# Patient Record
Sex: Female | Born: 1985 | Race: Black or African American | Hispanic: No | Marital: Single | Smoking: Never smoker
Health system: Southern US, Community
[De-identification: ages and names within clinical notes are randomized; demographics above are authoritative.]

## PROBLEM LIST (undated history)

## (undated) DIAGNOSIS — I10 Essential (primary) hypertension: Secondary | ICD-10-CM

## (undated) DIAGNOSIS — O34219 Maternal care for unspecified type scar from previous cesarean delivery: Secondary | ICD-10-CM

---
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
## 2007-04-08 ENCOUNTER — Other Ambulatory Visit: Admission: RE | Admit: 2007-04-08 | Discharge: 2007-04-08 | Payer: Self-pay | Admitting: Internal Medicine

## 2008-08-28 ENCOUNTER — Inpatient Hospital Stay (HOSPITAL_COMMUNITY): Admission: AD | Admit: 2008-08-28 | Discharge: 2008-09-02 | Payer: Self-pay | Admitting: Obstetrics & Gynecology

## 2008-08-28 ENCOUNTER — Ambulatory Visit: Payer: Self-pay | Admitting: Family Medicine

## 2008-08-30 ENCOUNTER — Encounter: Payer: Self-pay | Admitting: Family Medicine

## 2008-09-06 ENCOUNTER — Ambulatory Visit: Payer: Self-pay | Admitting: Physician Assistant

## 2008-09-06 ENCOUNTER — Inpatient Hospital Stay (HOSPITAL_COMMUNITY): Admission: AD | Admit: 2008-09-06 | Discharge: 2008-09-06 | Payer: Self-pay | Admitting: Obstetrics & Gynecology

## 2008-09-06 ENCOUNTER — Ambulatory Visit: Payer: Self-pay | Admitting: Obstetrics & Gynecology

## 2008-10-05 ENCOUNTER — Emergency Department (HOSPITAL_COMMUNITY): Admission: EM | Admit: 2008-10-05 | Discharge: 2008-10-05 | Payer: Self-pay | Admitting: Emergency Medicine

## 2009-04-07 ENCOUNTER — Emergency Department (HOSPITAL_COMMUNITY): Admission: EM | Admit: 2009-04-07 | Discharge: 2009-04-07 | Payer: Self-pay | Admitting: Emergency Medicine

## 2009-05-07 ENCOUNTER — Emergency Department (HOSPITAL_COMMUNITY): Admission: EM | Admit: 2009-05-07 | Discharge: 2009-05-07 | Payer: Self-pay | Admitting: Family Medicine

## 2009-05-18 ENCOUNTER — Emergency Department (HOSPITAL_COMMUNITY): Admission: EM | Admit: 2009-05-18 | Discharge: 2009-05-18 | Payer: Self-pay | Admitting: Emergency Medicine

## 2009-06-27 ENCOUNTER — Emergency Department (HOSPITAL_COMMUNITY): Admission: EM | Admit: 2009-06-27 | Discharge: 2009-06-27 | Payer: Self-pay | Admitting: Emergency Medicine

## 2009-12-17 ENCOUNTER — Emergency Department (HOSPITAL_COMMUNITY): Admission: EM | Admit: 2009-12-17 | Discharge: 2009-12-17 | Payer: Self-pay | Admitting: Emergency Medicine

## 2009-12-20 ENCOUNTER — Emergency Department (HOSPITAL_COMMUNITY): Admission: EM | Admit: 2009-12-20 | Discharge: 2009-12-20 | Payer: Self-pay | Admitting: Emergency Medicine

## 2009-12-23 ENCOUNTER — Emergency Department (HOSPITAL_COMMUNITY): Admission: EM | Admit: 2009-12-23 | Discharge: 2009-12-23 | Payer: Self-pay | Admitting: Emergency Medicine

## 2010-08-28 IMAGING — CT CT HEAD W/O CM
1 series · 16 of 30 positions shown, 20 images · non-contrast
Comparison: None

CLINICAL DATA: Headaches.

CT HEAD WITHOUT CONTRAST
TECHNIQUE: Contiguous axial images were obtained from the base of
the skull through the vertex without contrast.

[Series 2: head routine 4.8 h37s · axial · 0.43mm/px · z∈[-161,-9]mm · 16 of 36 slices shown, 20 images]
[im 2/36  brain]
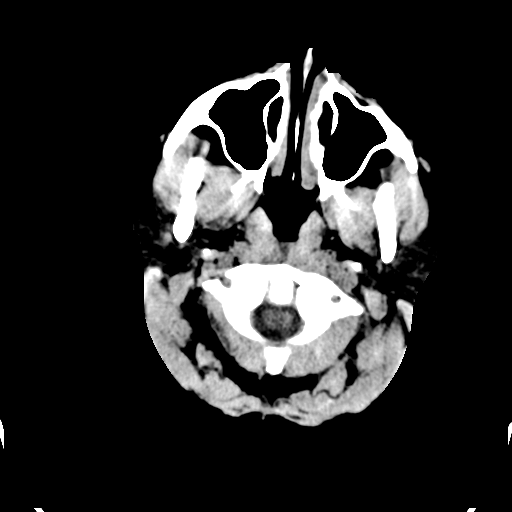
[im 2/36  bone]
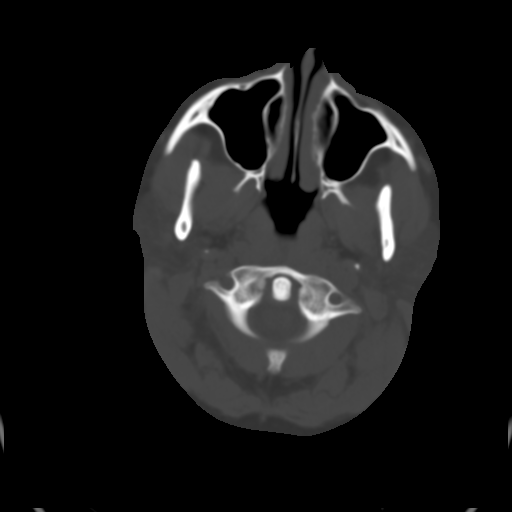
[im 4/36  brain]
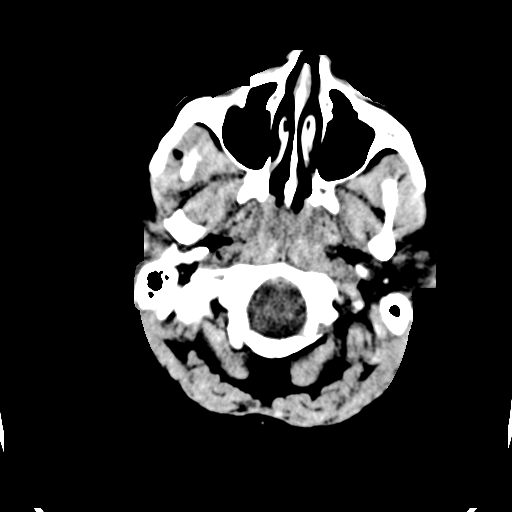
[im 7/36  brain]
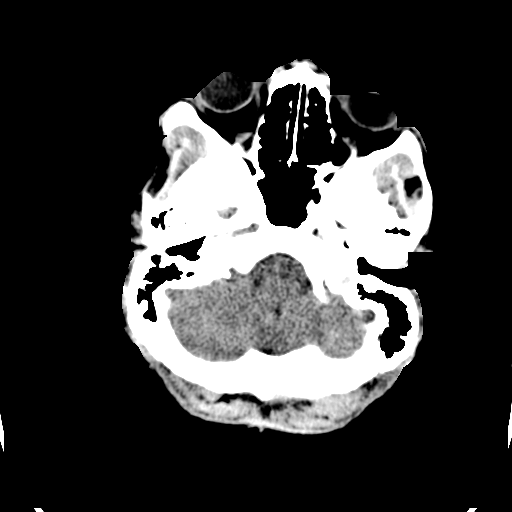
[im 9/36  brain]
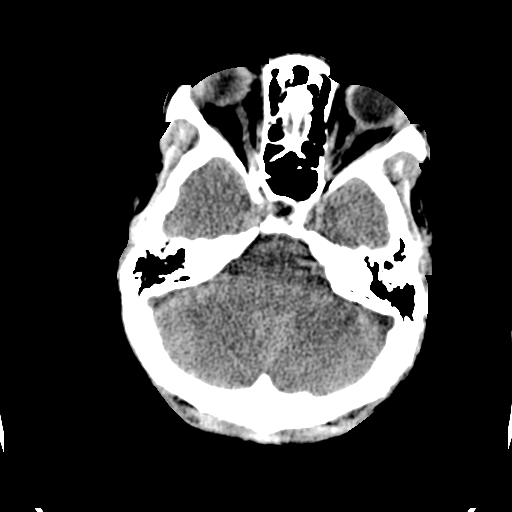
[im 10/36  brain]
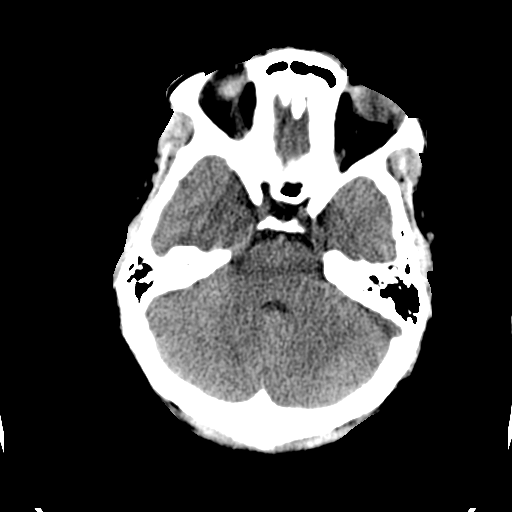
[im 10/36  bone]
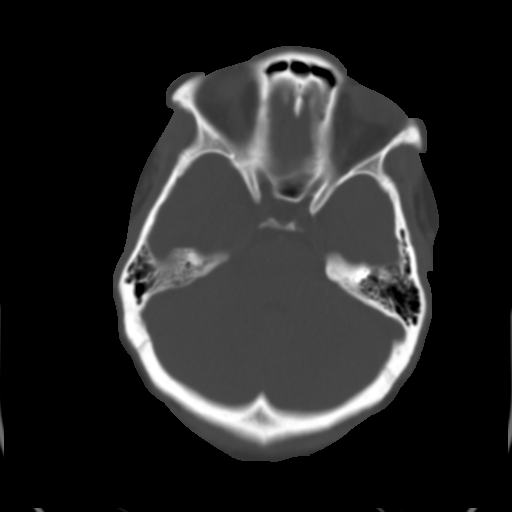
[im 13/36  brain]
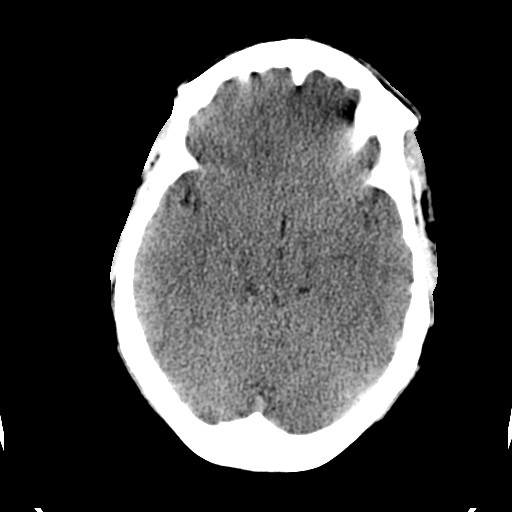
[im 15/36  brain]
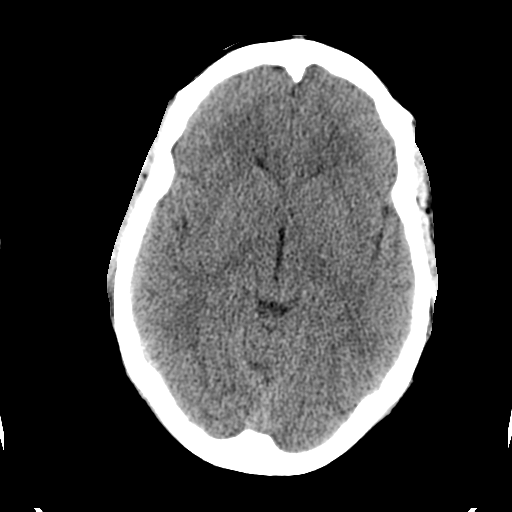
[im 17/36  brain]
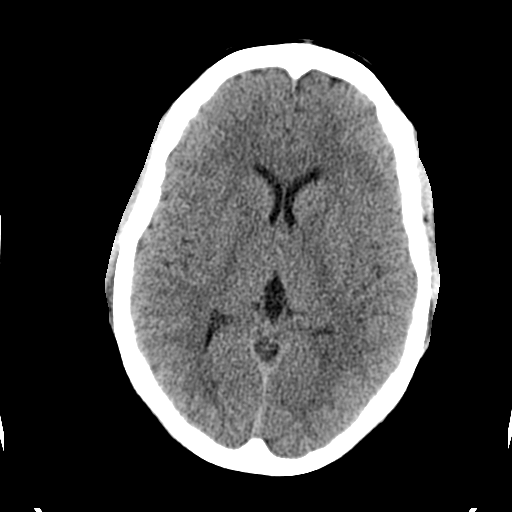
[im 19/36  brain]
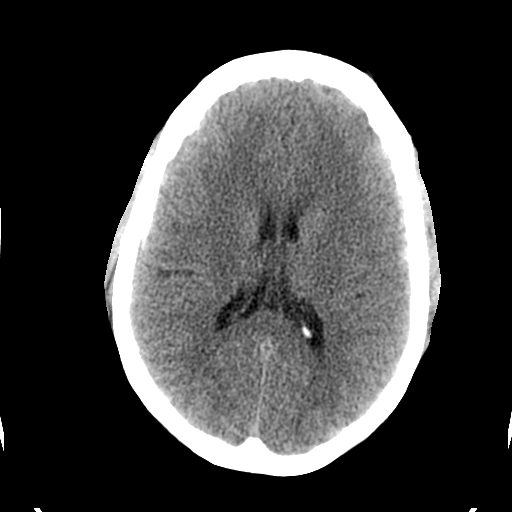
[im 19/36  bone]
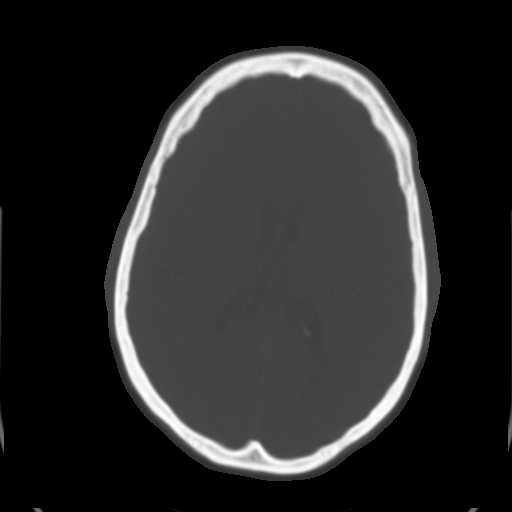
[im 21/36  brain]
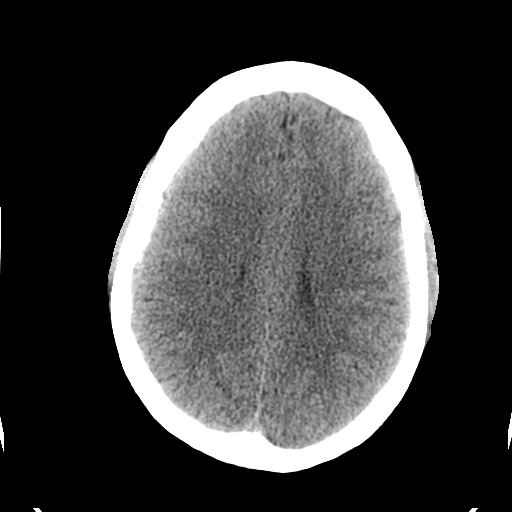
[im 23/36  brain]
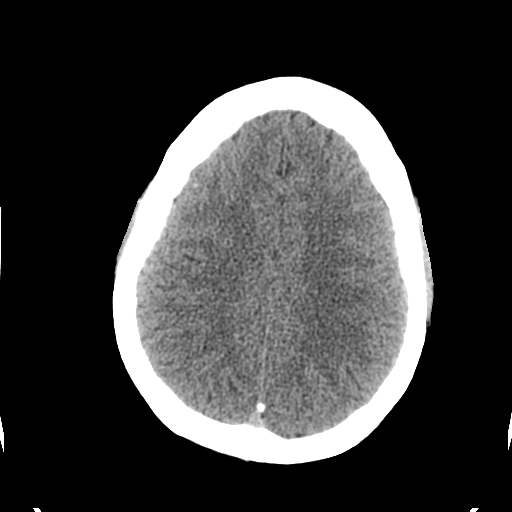
[im 26/36  brain]
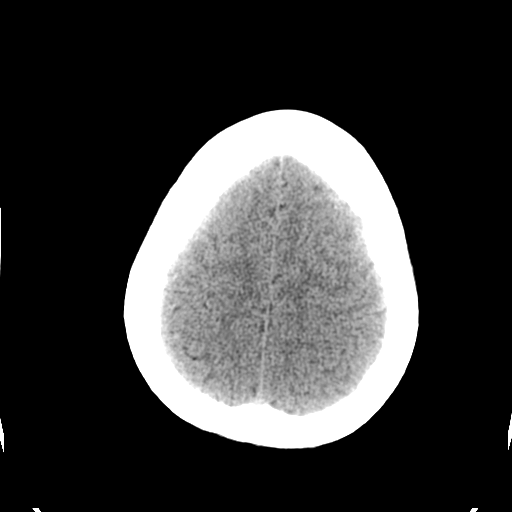
[im 27/36  brain]
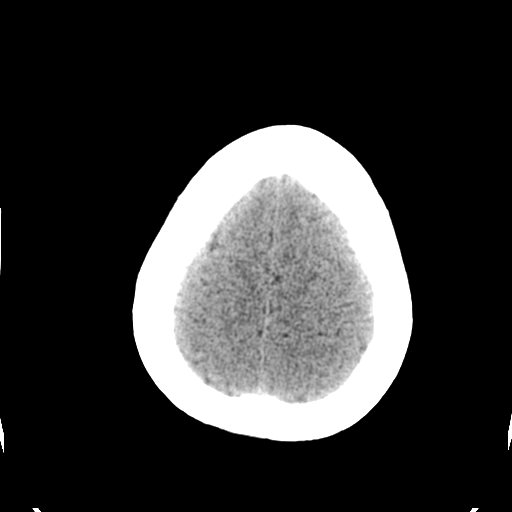
[im 27/36  bone]
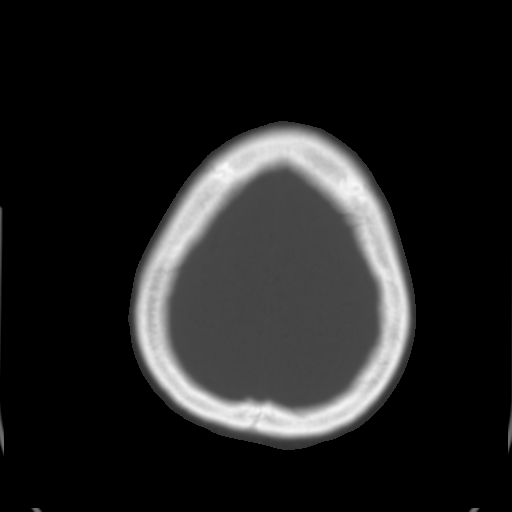
[im 29/36  brain]
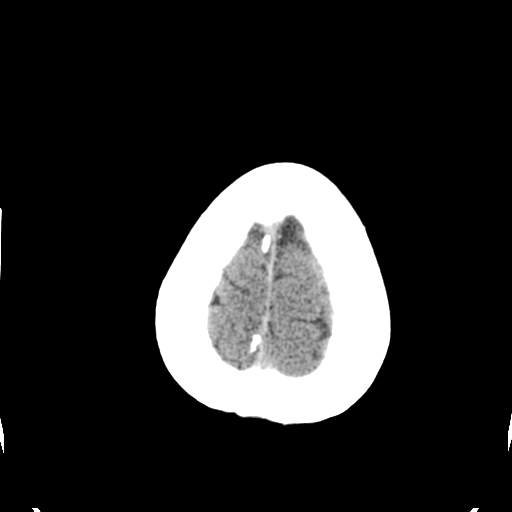
[im 32/36  brain]
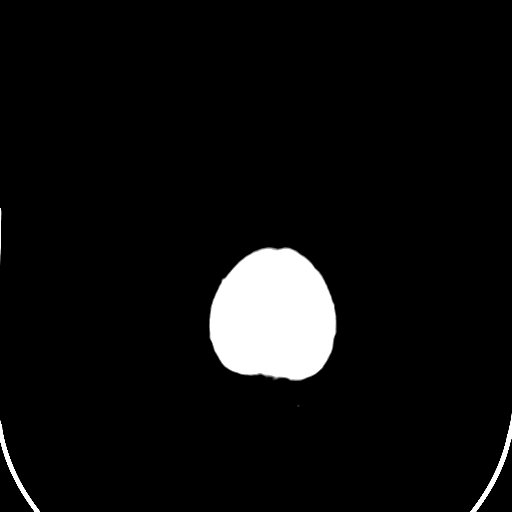
[im 34/36  brain]
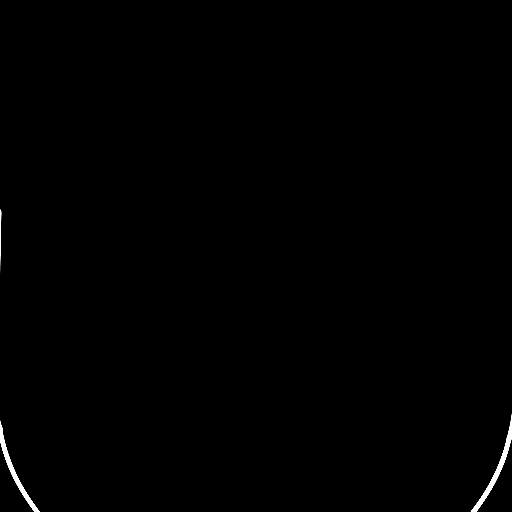

[16 of 30 positions shown; findings below may reference images not displayed]

FINDINGS: No intracranial abnormalities are identified, including
mass lesion or mass effect, hydrocephalus, extra-axial fluid
collection, midline shift, hemorrhage, or acute infarction.

The visualized bony calvarium is unremarkable.
IMPRESSION: Negative noncontrast head CT.

## 2010-12-25 LAB — CBC
Hemoglobin: 12.8 g/dL (ref 12.0–15.0)
Platelets: 264 10*3/uL (ref 150–400)
RBC: 4.82 MIL/uL (ref 3.87–5.11)
RDW: 13 % (ref 11.5–15.5)

## 2010-12-25 LAB — DIFFERENTIAL
Basophils Absolute: 0 10*3/uL (ref 0.0–0.1)
Eosinophils Absolute: 0.1 10*3/uL (ref 0.0–0.7)
Eosinophils Relative: 2 % (ref 0–5)
Lymphocytes Relative: 45 % (ref 12–46)
Lymphs Abs: 3.3 10*3/uL (ref 0.7–4.0)
Monocytes Relative: 6 % (ref 3–12)
Neutrophils Relative %: 47 % (ref 43–77)

## 2010-12-25 LAB — BASIC METABOLIC PANEL
BUN: 6 mg/dL (ref 6–23)
GFR calc Af Amer: >60 mL/min (ref >60)
Sodium: 137 mEq/L (ref 135–145)

## 2010-12-25 LAB — POCT CARDIAC MARKERS
CKMB, poc: <1 ng/mL — ABNORMAL LOW (ref 1.0–8.0)
Troponin i, poc: <0.05 ng/mL (ref 0.00–0.09)

## 2010-12-27 LAB — URINALYSIS, ROUTINE W REFLEX MICROSCOPIC
Hgb urine dipstick: NEGATIVE
Nitrite: NEGATIVE
Protein, ur: NEGATIVE mg/dL
Specific Gravity, Urine: 1.035 — ABNORMAL HIGH (ref 1.005–1.030)
Urobilinogen, UA: 1 mg/dL (ref 0.0–1.0)

## 2010-12-27 LAB — POCT PREGNANCY, URINE: Preg Test, Ur: NEGATIVE

## 2010-12-27 LAB — URINE MICROSCOPIC-ADD ON

## 2011-01-05 LAB — CBC
HCT: 32.2 % — ABNORMAL LOW (ref 36.0–46.0)
MCHC: 32.7 g/dL (ref 30.0–36.0)
MCV: 80.2 fL (ref 78.0–100.0)
Platelets: 258 10*3/uL (ref 150–400)
RDW: 14.6 % (ref 11.5–15.5)

## 2011-01-05 LAB — DIFFERENTIAL
Basophils Relative: 1 % (ref 0–1)
Lymphocytes Relative: 35 % (ref 12–46)
Monocytes Absolute: 0.5 10*3/uL (ref 0.1–1.0)

## 2011-01-05 LAB — PREGNANCY, URINE: Preg Test, Ur: NEGATIVE

## 2011-01-05 LAB — URINE MICROSCOPIC-ADD ON

## 2011-01-05 LAB — COMPREHENSIVE METABOLIC PANEL
AST: 16 U/L (ref 0–37)
Albumin: 3.9 g/dL (ref 3.5–5.2)
Alkaline Phosphatase: 84 U/L (ref 39–117)
BUN: 11 mg/dL (ref 6–23)
CO2: 24 mEq/L (ref 19–32)
Chloride: 100 mEq/L (ref 96–112)
Glucose, Bld: 91 mg/dL (ref 70–99)
Potassium: 3.2 mEq/L — ABNORMAL LOW (ref 3.5–5.1)
Sodium: 135 mEq/L (ref 135–145)

## 2011-01-05 LAB — URINALYSIS, ROUTINE W REFLEX MICROSCOPIC
Bilirubin Urine: NEGATIVE
Glucose, UA: NEGATIVE mg/dL
Ketones, ur: NEGATIVE mg/dL
Specific Gravity, Urine: 1.015 (ref 1.005–1.030)
pH: 7 (ref 5.0–8.0)

## 2011-01-05 LAB — POCT I-STAT, CHEM 8
HCT: 33 % — ABNORMAL LOW (ref 36.0–46.0)
Hemoglobin: 11.2 g/dL — ABNORMAL LOW (ref 12.0–15.0)
Sodium: 140 mEq/L (ref 135–145)

## 2011-01-05 LAB — LIPASE, BLOOD: Lipase: 26 U/L (ref 11–59)

## 2011-01-05 LAB — RAPID URINE DRUG SCREEN, HOSP PERFORMED
Amphetamines: NOT DETECTED
Benzodiazepines: NOT DETECTED
Opiates: NOT DETECTED
Tetrahydrocannabinol: NOT DETECTED

## 2011-02-03 NOTE — Discharge Summary (Signed)
[name], [name]            ACCOUNT NO.:  [id number]   MEDICAL RECORD NO.:  [id number]          PATIENT TYPE:  INP   LOCATION:  9126                          [facility]ILITY[facility]  [facility]   PHYSICIAN:  Rodney Langton, MDDATE OF BIRTH:  01/29/1986   DATE OF ADMISSION:  08/28/2008  DATE OF DISCHARGE:  09/02/2008                               DISCHARGE SUMMARY   ATTENDING PHYSICIAN:  Lesly Dukes, MD   ADMISSION DIAGNOSIS:  Spontaneous rupture of membranes at home.   DISCHARGE DIAGNOSES:  Term pregnancy, delivered, and premature rupture  of membranes for 24 hours.   PROCEDURE PERFORMED:  Nonstress test and low transverse cesarean  section.   BRIEF HISTORY AND HOSPITAL COURSE:  This is a 25 year old G1, now P1 at  spontaneous rupture of membranes at home, was admitted to L and D and  went on to arrest of descent and nonreassuring fetal heart tracings and  was then sent to the OR for primary low transverse cesarean section,  delivered a viable female with Apgars of 8 at 1-minute and 9 at 5 minutes,  7 pounds 1 ounce, three-vessel cord, under epidural anesthesia.  Estimated blood loss was 700 mL.  Wound was closed with staples, and the  patient is bottle feeding and desires Micronor for contraception.  The  patient followed a routine postop course, and by day of discharge, was  ambulatory, voiding and stooling freely and was tolerating a regular  diet.  The patient labs were GBS positive, GC and Chlamydia negative,  hep B negative, RPR nonreactive, rubella immune.  Urine drug screen was  negative, HIV negative.  Blood type O positive.  Antibody screen  negative.   ACTIVITIES:  Pelvic rest for 6 weeks and no heavy lifting greater than  10 pounds by OB routine.   MEDICATIONS AT DISCHARGE:  1. Prenatal vitamins 1 tablet p.o. daily.  2. Iron sulfate 325 mg p.o. b.i.d.  3. Colace 100 mg p.o. b.i.d. p.r.n. constipation.  4. Ibuprofen 600 mg p.o. q.6 h. p.r.n. pain.  5. Percocet 1 tablet  5/325 mg q.4-6 h. p.r.n. pain.  6. Ortho Micronor 0.35 mg 1 tablet p.o. daily for contraception.   DISCHARGE CONDITION:  Stable.   DISPOSITION:  Will be to home.   INSTRUCTIONS:  Routine.   FOLLOWUP:  Will be in 6 weeks at the [facility] Department  and baby love nurse will be sent to the patient's house on postop day #5  that will be September 04, 2008, for removal of staples.      Rodney Langton, MD  Electronically Signed     TT/MEDQ  D:  09/02/2008  T:  09/02/2008  Job:  [id number]

## 2011-02-03 NOTE — Op Note (Signed)
[name], [name]            ACCOUNT NO.:  [id number]   MEDICAL RECORD NO.:  [id number]         PATIENT TYPE:  WINP   LOCATION:                                [facility]   PHYSICIAN:  Tanya S. Shawnie Pons, M.D.   DATE OF BIRTH:  07/06/1986   DATE OF PROCEDURE:  08/30/2008  DATE OF DISCHARGE:                               OPERATIVE REPORT   PREOPERATIVE DIAGNOSES:  1. Intrauterine pregnancy.  2. Nonreassuring fetal heart tracing.  3. Arrest of descent.   POSTOPERATIVE DIAGNOSES:  1. Intrauterine pregnancy.  2. Nonreassuring fetal heart tracing.  3. Arrest of descent.   PROCEDURE:  Primary low transverse cesarean section.   SURGEON:  Shelbie Proctor. Shawnie Pons, MD   ASSISTANT:  Odie Sera, DO   ANESTHESIA:  Epidural and local.   REASON FOR PROCEDURE:  Ms. [name], a 25 year old, gravida 1,  who presented with spontaneous rupture of membranes and a history of  absolutely no prenatal care.  Clinically, she was determined to be close  to term and an induction of labor was performed.  Her induction of labor  was essentially uncomplicated; however, her second stage of labor was  complicated by a prolonged course and further complicated by  nonreassuring fetal heart tracing and arrest of descent.  The patient  had been pushing for 3 hours with essentially no change in station and  repetitive at times prolonged decelerations.  Decision was made to  proceed with a primary low transverse C-section.  The patient was  counseled of the risks and benefits to include, but not limited to  bleeding, infection, damage to internal organs, and the patient  understood these risks and agreed to proceed with the surgery.   DESCRIPTION OF PROCEDURE:  The patient was taken to operating room where  her epidural was dosed.  She was prepped and draped in the usual sterile  manner.  Appropriate anesthesia was confirmed.  A time-out was  conducted.  A Pfannenstiel incision was made in the skin with  a scalpel  and continuing through down to the fascial layers.  The fascia was  incised in the midline with the scalpel.  The incision was then extended  with manual traction.  The rectus muscles were separated with manual  traction.  The peritoneum was entered bluntly with finger, and the  peritoneal opening was extended again using manual traction.  A bladder  blade was placed, and a transverse incision was made in the lower  uterine segment with the scalpel.  The incision was carried down through  the layers until clear amniotic fluid was noted.  The uterine incision  was extended with manual traction.  With labor room nurse giving upward  pressure on the baby's head, the baby's head was grasped and elevated  out of the pelvis without difficulty.  With the assistance of fundal  pressure, the head was then delivered through the uterine incision.  The  mouth and nares were bulb suctioned.  Both shoulders were then delivered  with the assistance of fundal pressure followed by rest of the corpus.  The mouth and nares were  bulb suctioned.  Again, the cord was clamped  and cut, and the baby was handed to the waiting NICU staff with a  spontaneous cry and good tone.  The placenta then delivered with the  assistance of manual extraction.  The uterine incision was then closed  with 0 Vicryl in a running interlocking fashion.  A second layer of  closure was then performed.  Excellent hemostasis was noted.  The fascia  was then closed with 0 Vicryl in a running noninterlocking fashion.  Good hemostasis was again noted.  No defects were noted.  The skin was  then closed with staples, and a pressure dressing was applied.  A 10 mL  of 0.5% local anesthesia was injected above the incision and 10 mL were  injected below and then 5 mL were injected at each anterior and superior  iliac spine.  A pressure dressing was then applied.   FINDINGS:  1. Clear amniotic fluid.  2. Viable female infant.    SPECIMENS:  Placenta.   DISPOSITION:  Specimen to Labor and Delivery.   ESTIMATED BLOOD LOSS:  700 mL.   COMPLICATIONS:  None immediate.   The patient was taken to the PACU in good condition.      Odie Sera, DO  Electronically Signed     ______________________________  Shelbie Proctor. Shawnie Pons, M.D.    MC/MEDQ  D:  08/30/2008  T:  08/31/2008  Job:  [id number]

## 2011-02-06 NOTE — Discharge Summary (Signed)
NAMESHERAN, NEWSTROM            ACCOUNT NO.:  [id number]   MEDICAL RECORD NO.:  [id number]          PATIENT TYPE:  INP   LOCATION:  9126                          [facility]ILITY[facility]   PHYSICIAN:  Norton Blizzard, MD    DATE OF BIRTH:  1985/09/23   DATE OF ADMISSION:  08/28/2008  DATE OF DISCHARGE:  09/02/2008                               DISCHARGE SUMMARY   ADMISSION DIAGNOSIS:  Spontaneous rupture of membranes.   PROCEDURE PERFORMED:  Nonstress test low transverse cesarean section.   DISCHARGE DIAGNOSES:  1. Status post primary low transverse cesarean section for      nonreassuring fetal heart tones.  2. Arrest of descent.  3. Premature rupture of membranes x 24 hours.   HISTORY IN HOSPITAL COURSE:  Briefly this is a 25 year old, G1, P1 with  spontaneous rupture of membranes at home, arrest of descent, and  nonreassuring fetal heart tones, who underwent aprimary low transverse  cesarean section.  Female baby had Apgars of 8 at 1-minute and 9 at 5  minutes, weight was 7 pounds 1-ounce.  Estimated blood loss was 700 mL.  There was staple closure of the incision.  The patient is bottle feeding  and plans to use Micronor for contraception.   LABS:  GBS positive, GC and chlamydia were negative, negative hepatitis  B, surface antigen negative, RPR nonreactive, and rubella immune.  Urine  drug screen was negative.  HIV was negative.  Blood type was O positive.  Antibody screen negative.   ACTIVITY AT DISCHARGE:  Pelvic rest for 6 weeks and no heavy lifting of  greater than 10 pounds.   DIET:  Routine.   MEDICATIONS AT DISCHARGE:  1. Prenatal vitamins 1 tablet p.o. daily.  2. Iron sulfate 325 mg p.o. b.i.d.  3. Colace 100 mg p.o. b.i.d. p.r.n. constipation.  4. Ibuprofen 600 mg p.o. q.6 h. p.r.n. pain.  5. Percocet 5/325 one tablet p.o. q.4-6 h. p.r.n. pain.  6. Micronor 0.35 mg 1 tablet p.o. daily.   DISCHARGE CONDITION:  Stable.   DISCHARGE DISPOSITION:  Home.   FOLLOWUP:  A  follow up will be in 6 weeks at the [facility]  Department.  Baby Love nurse will also come out on postop day #5 to  remove staples.      Rodney Langton, MD      Norton Blizzard, MD  Electronically Signed    TT/MEDQ  D:  10/09/2008  T:  10/10/2008  Job:  [id number]

## 2011-06-26 LAB — COMPREHENSIVE METABOLIC PANEL
ALT: 15 U/L (ref 0–35)
AST: 14 U/L (ref 0–37)
Albumin: 3 g/dL — ABNORMAL LOW (ref 3.5–5.2)
Alkaline Phosphatase: 135 U/L — ABNORMAL HIGH (ref 39–117)
CO2: 23 mEq/L (ref 19–32)
Chloride: 105 mEq/L (ref 96–112)
Creatinine, Ser: 0.59 mg/dL (ref 0.4–1.2)
GFR calc Af Amer: >60 mL/min (ref >60)
GFR calc non Af Amer: >60 mL/min (ref >60)
Potassium: 3.4 mEq/L — ABNORMAL LOW (ref 3.5–5.1)
Sodium: 136 mEq/L (ref 135–145)
Total Bilirubin: 0.3 mg/dL (ref 0.3–1.2)

## 2011-06-26 LAB — URINALYSIS, ROUTINE W REFLEX MICROSCOPIC
Bilirubin Urine: NEGATIVE
Bilirubin Urine: NEGATIVE
Glucose, UA: NEGATIVE mg/dL
Hgb urine dipstick: NEGATIVE
Nitrite: NEGATIVE
Protein, ur: NEGATIVE mg/dL
Protein, ur: NEGATIVE mg/dL
Specific Gravity, Urine: 1.01 (ref 1.005–1.030)
Urobilinogen, UA: 0.2 mg/dL (ref 0.0–1.0)
Urobilinogen, UA: 2 mg/dL — ABNORMAL HIGH (ref 0.0–1.0)

## 2011-06-26 LAB — RAPID URINE DRUG SCREEN, HOSP PERFORMED
Amphetamines: NOT DETECTED
Barbiturates: NOT DETECTED
Benzodiazepines: NOT DETECTED
Opiates: NOT DETECTED
Tetrahydrocannabinol: NOT DETECTED

## 2011-06-26 LAB — CBC
HCT: 29.7 % — ABNORMAL LOW (ref 36.0–46.0)
Hemoglobin: 9.8 g/dL — ABNORMAL LOW (ref 12.0–15.0)
MCHC: 33.6 g/dL (ref 30.0–36.0)
MCV: 81.6 fL (ref 78.0–100.0)
MCV: 82 fL (ref 78.0–100.0)
Platelets: 217 10*3/uL (ref 150–400)
Platelets: 507 10*3/uL — ABNORMAL HIGH (ref 150–400)
RBC: 2.93 MIL/uL — ABNORMAL LOW (ref 3.87–5.11)
RBC: 3.65 MIL/uL — ABNORMAL LOW (ref 3.87–5.11)
RBC: 3.82 MIL/uL — ABNORMAL LOW (ref 3.87–5.11)
RDW: 12.8 % (ref 11.5–15.5)
WBC: 12 10*3/uL — ABNORMAL HIGH (ref 4.0–10.5)
WBC: 9.7 10*3/uL (ref 4.0–10.5)

## 2011-06-26 LAB — HEPATITIS B SURFACE ANTIGEN: Hepatitis B Surface Ag: NEGATIVE

## 2011-06-26 LAB — DIFFERENTIAL
Eosinophils Relative: 1 % (ref 0–5)
Lymphocytes Relative: 23 % (ref 12–46)
Lymphs Abs: 2.3 10*3/uL (ref 0.7–4.0)
Monocytes Absolute: 0.8 10*3/uL (ref 0.1–1.0)
Neutro Abs: 6.4 10*3/uL (ref 1.7–7.7)

## 2011-06-26 LAB — ABO/RH: ABO/RH(D): O POS

## 2011-06-26 LAB — STREP B DNA PROBE: Strep Group B Ag: POSITIVE

## 2011-06-26 LAB — RUBELLA SCREEN: Rubella: 17.5 IU/mL — ABNORMAL HIGH

## 2011-06-26 LAB — URINE MICROSCOPIC-ADD ON

## 2011-06-26 LAB — TYPE AND SCREEN
ABO/RH(D): O POS
Antibody Screen: NEGATIVE

## 2011-06-26 LAB — RPR: RPR Ser Ql: NONREACTIVE

## 2011-06-26 LAB — GC/CHLAMYDIA PROBE AMP, GENITAL: Chlamydia, DNA Probe: NEGATIVE

## 2011-09-22 DIAGNOSIS — R87629 Unspecified abnormal cytological findings in specimens from vagina: Secondary | ICD-10-CM

## 2011-09-22 HISTORY — DX: Unspecified abnormal cytological findings in specimens from vagina: R87.629

## 2011-12-08 ENCOUNTER — Encounter (HOSPITAL_COMMUNITY): Payer: Self-pay | Admitting: Emergency Medicine

## 2011-12-08 ENCOUNTER — Emergency Department (HOSPITAL_COMMUNITY)
Admission: EM | Admit: 2011-12-08 | Discharge: 2011-12-08 | Disposition: A | Discharge status: Home / Self Care | Payer: Medicaid Other | Source: Home / Self Care

## 2011-12-08 DIAGNOSIS — J069 Acute upper respiratory infection, unspecified: Secondary | ICD-10-CM

## 2011-12-08 HISTORY — DX: Essential (primary) hypertension: I10

## 2011-12-08 MED ORDER — IBUPROFEN 800 MG PO TABS: 800.0000 mg | ORAL_TABLET | Freq: Three times a day (TID) | ORAL | Status: AC

## 2011-12-08 MED ORDER — LORATADINE 10 MG PO TABS: 10.0000 mg | ORAL_TABLET | Freq: Every day | ORAL | Status: AC

## 2011-12-08 NOTE — ED Notes (Signed)
Pt c/o sore throat and running nose X 2 days, cough started today. Pt stated she took tylenol last night but it didn't relieve the pain.

## 2011-12-08 NOTE — Discharge Instructions (Signed)
Ibuprofen as needed for discomfort. Increase fluids. May also use throat lozenges or Chloraseptic spray as needed for sore throat. You can take Coricidan HBP for congestion. Avoid other decongestants due to your high blood pressure.  Start your blood pressure medication today. Return if symptoms change or worsen.

## 2011-12-08 NOTE — ED Provider Notes (Signed)
History     CSN: [id number]  Arrival date & time 12/08/11  1714   None     Chief Complaint  Patient presents with  . Sore Throat    (Consider location/radiation/quality/duration/timing/severity/associated sxs/prior treatment) HPI Comments: Onset of sore throat 2 days ago, progressively worsening. More painful when she swallows. No fever or chills. Also has nasal congestion and mild nonproductive cough.  Took Tylenol last night with mild relief. No known sick contacts.    Past Medical History  Diagnosis Date  . Hypertension     Past Surgical History  Procedure Date  . Cesarean section     Family History  Problem Relation Age of Onset  . Hypertension Other   . Diabetes Other     History  Substance Use Topics  . Smoking status: Never Smoker   . Smokeless tobacco: Not on file  . Alcohol Use: No    OB History    Grav Para Term Preterm Abortions TAB SAB Ect Mult Living                  Review of Systems  Constitutional: Positive for fatigue. Negative for fever and chills.  HENT: Positive for congestion, sore throat and rhinorrhea. Negative for ear pain, postnasal drip and sinus pressure.   Respiratory: Positive for cough. Negative for shortness of breath and wheezing.     Allergies  Review of patient's allergies indicates no known allergies.  Home Medications   Current Outpatient Rx  Name Route Sig Dispense Refill  . HYDROCHLOROTHIAZIDE 12.5 MG PO CAPS Oral Take 12.5 mg by mouth daily.    . IBUPROFEN 800 MG PO TABS Oral Take 1 tablet (800 mg total) by mouth 3 (three) times daily. 15 tablet 0  . LORATADINE 10 MG PO TABS Oral Take 1 tablet (10 mg total) by mouth daily. 10 tablet 0    BP 160/91  Pulse 113  Temp(Src) 99.6 F (37.6 C) (Oral)  Resp 16  SpO2 100%  LMP 12/04/2011  Physical Exam  Constitutional: She appears well-developed and well-nourished. No distress.  HENT:  Head: Normocephalic and atraumatic.  Right Ear: Tympanic membrane, external  ear and ear canal normal.  Left Ear: Tympanic membrane, external ear and ear canal normal.  Nose: Nose normal.  Mouth/Throat: Uvula is midline, oropharynx is clear and moist and mucous membranes are normal. No oropharyngeal exudate, posterior oropharyngeal edema or posterior oropharyngeal erythema.  Neck: Neck supple.  Cardiovascular: Normal rate, regular rhythm and normal heart sounds.   Pulmonary/Chest: Effort normal and breath sounds normal. No respiratory distress.  Lymphadenopathy:    She has no cervical adenopathy.  Neurological: She is alert.  Skin: Skin is warm and dry.  Psychiatric: She has a normal mood and affect.    ED Course  Procedures (including critical care time)  Labs Reviewed - No data to display No results found.   1. Acute URI       MDM          Melody Comas, PA 12/08/11 2247

## 2011-12-09 NOTE — ED Provider Notes (Signed)
Medical screening examination/treatment/procedure(s) were performed by non-physician practitioner and as supervising physician I was immediately available for consultation/collaboration.   MORENO-COLL,Franci Oshana; MD   Cahlil Sattar Moreno-Coll, MD 12/09/11 2153 

## 2012-04-26 LAB — OB RESULTS CONSOLE GC/CHLAMYDIA: Chlamydia: NEGATIVE

## 2012-09-21 HISTORY — DX: Maternal care for unspecified type scar from previous cesarean delivery: O34.219

## 2012-09-21 NOTE — L&D Delivery Note (Signed)
Delivery Note At 1:55 PM a viable and healthy female was delivered via Vaginal, Spontaneous Delivery (Presentation: ; Occiput Anterior).  APGAR: 9, 9; weight P .   Placenta status: Intact, Spontaneous.  Cord: 3 vessels with the following complications: None.    Anesthesia: Epidural  Episiotomy: None Lacerations: 1st degree;Perineal Suture Repair: 3.0 vicryl rapide Est. Blood Loss (mL): 400cc  Mom to postpartum.  Baby to stay with mommy.  BOVARD,Shatavia Santor 10/25/2012, 2:10 PM  Bo/O+/ Contra Depo

## 2012-10-24 ENCOUNTER — Encounter (HOSPITAL_COMMUNITY): Payer: Self-pay

## 2012-10-24 ENCOUNTER — Inpatient Hospital Stay (HOSPITAL_COMMUNITY)
Admission: AD | Admit: 2012-10-24 | Discharge: 2012-10-27 | DRG: 774 | Disposition: A | Discharge status: Home / Self Care | Payer: 59 | Source: Ambulatory Visit | Attending: Obstetrics and Gynecology | Admitting: Obstetrics and Gynecology

## 2012-10-24 DIAGNOSIS — O1002 Pre-existing essential hypertension complicating childbirth: Secondary | ICD-10-CM | POA: Diagnosis present

## 2012-10-24 DIAGNOSIS — O99892 Other specified diseases and conditions complicating childbirth: Secondary | ICD-10-CM | POA: Diagnosis present

## 2012-10-24 DIAGNOSIS — O34219 Maternal care for unspecified type scar from previous cesarean delivery: Secondary | ICD-10-CM

## 2012-10-24 DIAGNOSIS — Z22330 Carrier of Group B streptococcus: Secondary | ICD-10-CM

## 2012-10-24 LAB — CBC
MCH: 25.1 pg — ABNORMAL LOW (ref 26.0–34.0)
Platelets: 228 10*3/uL (ref 150–400)
RBC: 4.18 MIL/uL (ref 3.87–5.11)
WBC: 9.5 10*3/uL (ref 4.0–10.5)

## 2012-10-24 LAB — POCT FERN TEST: POCT Fern Test: POSITIVE

## 2012-10-24 MED ORDER — LACTATED RINGERS IV SOLN
INTRAVENOUS | Status: DC
  Administered 2012-10-24 – 2012-10-25 (×3): via INTRAVENOUS

## 2012-10-24 MED ORDER — DEXTROSE 5 % IV SOLN
2.5000 10*6.[IU] | INTRAVENOUS | Status: DC
  Administered 2012-10-25 (×3): 2.5 10*6.[IU] via INTRAVENOUS
  Filled 2012-10-24 (×7): qty 2.5

## 2012-10-24 MED ORDER — CITRIC ACID-SODIUM CITRATE 334-500 MG/5ML PO SOLN: 30.0000 mL | ORAL | Status: DC | PRN

## 2012-10-24 MED ORDER — ONDANSETRON HCL 4 MG/2ML IJ SOLN
4.0000 mg | Freq: Four times a day (QID) | INTRAMUSCULAR | Status: DC | PRN
  Administered 2012-10-25: 4 mg via INTRAVENOUS
  Filled 2012-10-24: qty 2

## 2012-10-24 MED ORDER — ACETAMINOPHEN 325 MG PO TABS: 650.0000 mg | ORAL_TABLET | ORAL | Status: DC | PRN

## 2012-10-24 MED ORDER — LIDOCAINE HCL (PF) 1 % IJ SOLN
30.0000 mL | INTRAMUSCULAR | Status: DC | PRN
  Filled 2012-10-24: qty 30

## 2012-10-24 MED ORDER — LACTATED RINGERS IV SOLN
500.0000 mL | INTRAVENOUS | Status: DC | PRN
  Administered 2012-10-25: 300 mL via INTRAVENOUS

## 2012-10-24 MED ORDER — OXYCODONE-ACETAMINOPHEN 5-325 MG PO TABS: 1.0000 | ORAL_TABLET | ORAL | Status: DC | PRN

## 2012-10-24 MED ORDER — DEXTROSE 5 % IV SOLN
5.0000 10*6.[IU] | Freq: Once | INTRAVENOUS | Status: AC
  Administered 2012-10-25: 5 10*6.[IU] via INTRAVENOUS
  Filled 2012-10-24: qty 5

## 2012-10-24 MED ORDER — OXYTOCIN 40 UNITS IN LACTATED RINGERS INFUSION - SIMPLE MED: 62.5000 mL/h | INTRAVENOUS | Status: DC

## 2012-10-24 MED ORDER — IBUPROFEN 600 MG PO TABS: 600.0000 mg | ORAL_TABLET | Freq: Four times a day (QID) | ORAL | Status: DC | PRN

## 2012-10-24 MED ORDER — OXYTOCIN BOLUS FROM INFUSION: 500.0000 mL | INTRAVENOUS | Status: DC

## 2012-10-24 NOTE — MAU Note (Signed)
Large gush of clear fluid at 9pm tonight. Some contractions since then. Denies vaginal bleeding. Positive fetal movement.

## 2012-10-24 NOTE — H&P (Signed)
[name] is a 27 y.o. female, G2 P1001, EGA 38+ weeks with EDC 2-13 presenting for evaluation of leaking fluid.  Eval in MAU confirms ROM, irreg ctx.  Prenatal care complicated by previous LTCS, CTOL and desires VBAC.  Has chronic HTN controlled with Alsomet 500 mg PO bid.  See prenatal records for complete history.  Maternal Medical History:  Reason for admission: Reason for admission: rupture of membranes.  Fetal activity: Perceived fetal activity is normal.    Prenatal complications: Hypertension.     OB History    Grav Para Term Preterm Abortions TAB SAB Ect Mult Living   2 1 1       1     LTCS at term for NRFHT and arrest of descent  Past Medical History  Diagnosis Date  . Hypertension    Past Surgical History  Procedure Date  . Cesarean section    Family History: family history includes Diabetes in her other and Hypertension in her other. Social History:  reports that she has never smoked. She does not have any smokeless tobacco history on file. She reports that she does not drink alcohol or use illicit drugs.   Prenatal Transfer Tool  Maternal Diabetes: No Genetic Screening: Normal Maternal Ultrasounds/Referrals: Normal Fetal Ultrasounds or other Referrals:  None Maternal Substance Abuse:  No Significant Maternal Medications:  Meds include: Other:  Significant Maternal Lab Results:  Lab values include: Group B Strep positive Other Comments:  Chronic HTN on Aldomet, VBAC  Review of Systems  Respiratory: Negative.   Cardiovascular: Negative.     Dilation: 3 Effacement (%): 50 Station: -3 Exam by:: Lucy Chris RNC Blood pressure 119/77, pulse 84, temperature 98.5 F (36.9 C), temperature source Oral, resp. rate 18, height 5' (1.524 m), weight 85.73 kg (189 lb), last menstrual period 12/04/2011. Maternal Exam:  Uterine Assessment: Contraction strength is mild.  Contraction frequency is irregular.   Abdomen: Patient reports no abdominal tenderness. Surgical  scars: low transverse.   Estimated fetal weight is 7 1/2 lbs.   Fetal presentation: vertex  Introitus: Normal vulva. Normal vagina.  Ferning test: positive.  Amniotic fluid character: clear.  Pelvis: adequate for delivery.   Cervix: Cervix evaluated by digital exam.     Fetal Exam Fetal Monitor Review: Mode: ultrasound.   Baseline rate: 130.  Variability: moderate (6-25 bpm).   Pattern: accelerations present and no decelerations.    Fetal State Assessment: Category I - tracings are normal.     Physical Exam  Constitutional: She appears well-developed and well-nourished.  Cardiovascular: Normal rate, regular rhythm and normal heart sounds.   No murmur heard. Respiratory: Effort normal and breath sounds normal. No respiratory distress.  GI:       gravid    Prenatal labs: ABO, Rh:  O pos Antibody:  neg Rubella:  Imm RPR:   NR HBsAg:   Neg HIV:   Neg GBS:   Pos GCT:  130  Assessment/Plan: IUP at 38+ weeks with SROM, previous LTCS and desires VBAC.  CHTN well controlled with Aldomet.  Will admit and monitor progress, PCN for +GBS.     Ginna Schuur D 10/24/2012, 11:23 PM

## 2012-10-25 ENCOUNTER — Inpatient Hospital Stay (HOSPITAL_COMMUNITY): Payer: 59 | Admitting: Anesthesiology

## 2012-10-25 ENCOUNTER — Encounter (HOSPITAL_COMMUNITY): Payer: Self-pay | Admitting: Anesthesiology

## 2012-10-25 ENCOUNTER — Encounter (HOSPITAL_COMMUNITY): Payer: Self-pay | Admitting: Obstetrics and Gynecology

## 2012-10-25 DIAGNOSIS — O34219 Maternal care for unspecified type scar from previous cesarean delivery: Secondary | ICD-10-CM

## 2012-10-25 LAB — TYPE AND SCREEN
ABO/RH(D): O POS
Antibody Screen: NEGATIVE

## 2012-10-25 LAB — RPR: RPR Ser Ql: NONREACTIVE

## 2012-10-25 LAB — ABO/RH: ABO/RH(D): O POS

## 2012-10-25 MED ORDER — EPHEDRINE 5 MG/ML INJ
10.0000 mg | INTRAVENOUS | Status: DC | PRN
  Administered 2012-10-25: 10 mg via INTRAVENOUS

## 2012-10-25 MED ORDER — WITCH HAZEL-GLYCERIN EX PADS: 1.0000 "application " | MEDICATED_PAD | CUTANEOUS | Status: DC | PRN

## 2012-10-25 MED ORDER — SIMETHICONE 80 MG PO CHEW: 80.0000 mg | CHEWABLE_TABLET | ORAL | Status: DC | PRN

## 2012-10-25 MED ORDER — LACTATED RINGERS IV SOLN
500.0000 mL | Freq: Once | INTRAVENOUS | Status: AC
  Administered 2012-10-25: 500 mL via INTRAVENOUS

## 2012-10-25 MED ORDER — PRENATAL MULTIVITAMIN CH
1.0000 | ORAL_TABLET | Freq: Every day | ORAL | Status: DC
  Administered 2012-10-26 – 2012-10-27 (×2): 1 via ORAL
  Filled 2012-10-25 (×3): qty 1

## 2012-10-25 MED ORDER — ONDANSETRON HCL 4 MG PO TABS: 4.0000 mg | ORAL_TABLET | ORAL | Status: DC | PRN

## 2012-10-25 MED ORDER — DIPHENHYDRAMINE HCL 25 MG PO CAPS: 25.0000 mg | ORAL_CAPSULE | Freq: Four times a day (QID) | ORAL | Status: DC | PRN

## 2012-10-25 MED ORDER — PHENYLEPHRINE 40 MCG/ML (10ML) SYRINGE FOR IV PUSH (FOR BLOOD PRESSURE SUPPORT): 80.0000 ug | PREFILLED_SYRINGE | INTRAVENOUS | Status: DC | PRN

## 2012-10-25 MED ORDER — PHENYLEPHRINE 40 MCG/ML (10ML) SYRINGE FOR IV PUSH (FOR BLOOD PRESSURE SUPPORT)
80.0000 ug | PREFILLED_SYRINGE | INTRAVENOUS | Status: DC | PRN
  Filled 2012-10-25: qty 5

## 2012-10-25 MED ORDER — OXYCODONE-ACETAMINOPHEN 5-325 MG PO TABS
1.0000 | ORAL_TABLET | ORAL | Status: DC | PRN
  Administered 2012-10-25 (×2): 1 via ORAL
  Administered 2012-10-26: 2 via ORAL
  Filled 2012-10-25 (×3): qty 1
  Filled 2012-10-25: qty 2

## 2012-10-25 MED ORDER — ZOLPIDEM TARTRATE 5 MG PO TABS: 5.0000 mg | ORAL_TABLET | Freq: Every evening | ORAL | Status: DC | PRN

## 2012-10-25 MED ORDER — SENNOSIDES-DOCUSATE SODIUM 8.6-50 MG PO TABS
2.0000 | ORAL_TABLET | Freq: Every day | ORAL | Status: DC
  Administered 2012-10-25 – 2012-10-26 (×2): 2 via ORAL

## 2012-10-25 MED ORDER — LORATADINE 10 MG PO TABS
10.0000 mg | ORAL_TABLET | Freq: Every day | ORAL | Status: DC
  Filled 2012-10-25 (×2): qty 1

## 2012-10-25 MED ORDER — LACTATED RINGERS IV SOLN: INTRAVENOUS | Status: DC

## 2012-10-25 MED ORDER — DIPHENHYDRAMINE HCL 50 MG/ML IJ SOLN: 12.5000 mg | INTRAMUSCULAR | Status: DC | PRN

## 2012-10-25 MED ORDER — ONDANSETRON HCL 4 MG/2ML IJ SOLN: 4.0000 mg | INTRAMUSCULAR | Status: DC | PRN

## 2012-10-25 MED ORDER — OXYTOCIN 40 UNITS IN LACTATED RINGERS INFUSION - SIMPLE MED
1.0000 m[IU]/min | INTRAVENOUS | Status: DC
  Administered 2012-10-25: 1 m[IU]/min via INTRAVENOUS
  Administered 2012-10-25: 666 m[IU]/min via INTRAVENOUS
  Filled 2012-10-25: qty 1000

## 2012-10-25 MED ORDER — LIDOCAINE HCL (PF) 1 % IJ SOLN
INTRAMUSCULAR | Status: DC | PRN
  Administered 2012-10-25 (×2): 5 mL

## 2012-10-25 MED ORDER — TETANUS-DIPHTH-ACELL PERTUSSIS 5-2.5-18.5 LF-MCG/0.5 IM SUSP: 0.5000 mL | Freq: Once | INTRAMUSCULAR | Status: DC

## 2012-10-25 MED ORDER — LANOLIN HYDROUS EX OINT: TOPICAL_OINTMENT | CUTANEOUS | Status: DC | PRN

## 2012-10-25 MED ORDER — EPHEDRINE 5 MG/ML INJ
10.0000 mg | INTRAVENOUS | Status: DC | PRN
  Filled 2012-10-25 (×2): qty 4

## 2012-10-25 MED ORDER — FENTANYL 2.5 MCG/ML BUPIVACAINE 1/10 % EPIDURAL INFUSION (WH - ANES)
14.0000 mL/h | INTRAMUSCULAR | Status: DC
  Administered 2012-10-25 (×2): 14 mL/h via EPIDURAL
  Filled 2012-10-25 (×2): qty 125

## 2012-10-25 MED ORDER — IBUPROFEN 600 MG PO TABS
600.0000 mg | ORAL_TABLET | Freq: Four times a day (QID) | ORAL | Status: DC
  Administered 2012-10-25 – 2012-10-27 (×7): 600 mg via ORAL
  Filled 2012-10-25 (×7): qty 1

## 2012-10-25 MED ORDER — BENZOCAINE-MENTHOL 20-0.5 % EX AERO
1.0000 "application " | INHALATION_SPRAY | CUTANEOUS | Status: DC | PRN
  Administered 2012-10-25: 1 via TOPICAL
  Filled 2012-10-25: qty 56

## 2012-10-25 MED ORDER — DIBUCAINE 1 % RE OINT: 1.0000 "application " | TOPICAL_OINTMENT | RECTAL | Status: DC | PRN

## 2012-10-25 MED ORDER — TERBUTALINE SULFATE 1 MG/ML IJ SOLN: 0.2500 mg | Freq: Once | INTRAMUSCULAR | Status: DC | PRN

## 2012-10-25 MED ORDER — MEDROXYPROGESTERONE ACETATE 150 MG/ML IM SUSP
150.0000 mg | INTRAMUSCULAR | Status: AC | PRN
  Administered 2012-10-27: 150 mg via INTRAMUSCULAR
  Filled 2012-10-25: qty 1

## 2012-10-25 MED ORDER — PRENATAL MULTIVITAMIN CH: 1.0000 | ORAL_TABLET | Freq: Every day | ORAL | Status: DC

## 2012-10-25 NOTE — Anesthesia Procedure Notes (Signed)
Epidural Patient location during procedure: OB Start time: 10/25/2012 2:33 AM  Staffing Anesthesiologist: Angus Seller., Harrell Gave. Performed by: anesthesiologist   Preanesthetic Checklist Completed: patient identified, site marked, surgical consent, pre-op evaluation, timeout performed, IV checked, risks and benefits discussed and monitors and equipment checked  Epidural Patient position: sitting Prep: site prepped and draped and DuraPrep Patient monitoring: continuous pulse ox and blood pressure Approach: midline Injection technique: LOR air and LOR saline  Needle:  Needle type: Tuohy  Needle gauge: 17 G Needle length: 9 cm and 9 Needle insertion depth: 5 cm cm Catheter type: closed end flexible Catheter size: 19 Gauge Catheter at skin depth: 10 cm Test dose: negative  Assessment Events: blood not aspirated, injection not painful, no injection resistance, negative IV test and no paresthesia  Additional Notes Patient identified.  Risk benefits discussed including failed block, incomplete pain control, headache, nerve damage, paralysis, blood pressure changes, nausea, vomiting, reactions to medication both toxic or allergic, and postpartum back pain.  Patient expressed understanding and wished to proceed.  All questions were answered.  Sterile technique used throughout procedure and epidural site dressed with sterile barrier dressing. No paresthesia or other complications noted.The patient did not experience any signs of intravascular injection such as tinnitus or metallic taste in mouth nor signs of intrathecal spread such as rapid motor block. Please see nursing notes for vital signs.

## 2012-10-25 NOTE — Progress Notes (Signed)
The fhr tracing traced in obix on this chart on 10/25/12 during the time of 1747-1939 was in error. The tracing is from another pt  (mrn [id number]). Corrected at 1940

## 2012-10-25 NOTE — Anesthesia Preprocedure Evaluation (Signed)
Anesthesia Evaluation  Patient identified by MRN, date of birth, ID band Patient awake    Reviewed: Allergy & Precautions, H&P , Patient's Chart, lab work & pertinent test results  Airway Mallampati: III TM Distance: >3 FB Neck ROM: full    Dental No notable dental hx.    Pulmonary neg pulmonary ROS,  breath sounds clear to auscultation  Pulmonary exam normal       Cardiovascular hypertension, negative cardio ROS  Rhythm:regular Rate:Normal     Neuro/Psych negative neurological ROS  negative psych ROS   GI/Hepatic negative GI ROS, Neg liver ROS,   Endo/Other  negative endocrine ROSMorbid obesity  Renal/GU negative Renal ROS     Musculoskeletal   Abdominal   Peds  Hematology negative hematology ROS (+)   Anesthesia Other Findings   Reproductive/Obstetrics (+) Pregnancy                           Anesthesia Physical Anesthesia Plan  ASA: III  Anesthesia Plan: Epidural   Post-op Pain Management:    Induction:   Airway Management Planned:   Additional Equipment:   Intra-op Plan:   Post-operative Plan:   Informed Consent: I have reviewed the patients History and Physical, chart, labs and discussed the procedure including the risks, benefits and alternatives for the proposed anesthesia with the patient or authorized representative who has indicated his/her understanding and acceptance.     Plan Discussed with:   Anesthesia Plan Comments:         Anesthesia Quick Evaluation  

## 2012-10-26 LAB — CBC
Platelets: 190 10*3/uL (ref 150–400)
RDW: 13.5 % (ref 11.5–15.5)
WBC: 11.9 10*3/uL — ABNORMAL HIGH (ref 4.0–10.5)

## 2012-10-26 NOTE — Progress Notes (Signed)
Post Partum Day 1 Subjective: no complaints, up ad lib, tolerating PO and nl lochia, pain controlled  Objective: Blood pressure 113/65, pulse 77, temperature 98.3 F (36.8 C), temperature source Oral, resp. rate 18, height 5' (1.524 m), weight 85.73 kg (189 lb), last menstrual period 12/04/2011, SpO2 98.00%, unknown if currently breastfeeding.  Physical Exam:  General: alert and no distress Lochia: appropriate Uterine Fundus: firm   Basename 10/26/12 0532 10/24/12 2320  HGB 8.8* 10.5*  HCT 27.3* 32.5*    Assessment/Plan: Plan for discharge tomorrow, Breastfeeding and Lactation consult.  Routine care   LOS: 2 days   BOVARD,[name] 10/26/2012, 7:23 AM

## 2012-10-27 MED ORDER — PRENATAL MULTIVITAMIN CH: 1.0000 | ORAL_TABLET | Freq: Every day | ORAL | Status: AC

## 2012-10-27 MED ORDER — OXYCODONE-ACETAMINOPHEN 5-325 MG PO TABS: 1.0000 | ORAL_TABLET | Freq: Four times a day (QID) | ORAL | Status: DC | PRN

## 2012-10-27 MED ORDER — IBUPROFEN 800 MG PO TABS: 800.0000 mg | ORAL_TABLET | Freq: Three times a day (TID) | ORAL | Status: DC | PRN

## 2012-10-27 NOTE — Discharge Summary (Signed)
Obstetric Discharge Summary Reason for Admission: onset of labor Prenatal Procedures: none Intrapartum Procedures: spontaneous vaginal delivery and VBAC Postpartum Procedures: none Complications-Operative and Postpartum: vaginal laceration Hemoglobin  Date Value Range Status  10/26/2012 8.8* 12.0 - 15.0 g/dL Final     HCT  Date Value Range Status  10/26/2012 27.3* 36.0 - 46.0 % Final    Physical Exam:  General: alert and no distress Lochia: appropriate Uterine Fundus: firm  Discharge Diagnoses: Term Pregnancy-delivered  Discharge Information: Date: 10/27/2012 Activity: pelvic rest Diet: routine Medications: PNV, Ibuprofen and Percocet Condition: stable Instructions: refer to practice specific booklet Discharge to: home Follow-up Information    Follow up with BOVARD,Brendolyn Stockley, MD. Schedule an appointment as soon as possible for a visit in 6 weeks.   Contact information:   [address] [place] [address] [phone]          Newborn Data: Live born female  Birth Weight: 6 lb 1.9 oz (2775 g) APGAR: 9, 9  Home with mother.  BOVARD,Cassius Cullinane 10/27/2012, 9:16 AM

## 2012-10-27 NOTE — Progress Notes (Signed)
Post Partum Day 2 Subjective: no complaints, up ad lib, voiding and tolerating PO.  Nl lochia, pain controlled  Objective: Blood pressure 115/76, pulse 77, temperature 98 F (36.7 C), temperature source Oral, resp. rate 18, height 5' (1.524 m), weight 85.73 kg (189 lb), last menstrual period 12/04/2011, SpO2 98.00%, unknown if currently breastfeeding.  Physical Exam:  General: alert and no distress Lochia: appropriate Uterine Fundus: firm   Basename 10/26/12 0532 10/24/12 2320  HGB 8.8* 10.5*  HCT 27.3* 32.5*    Assessment/Plan: Discharge home, Breastfeeding and Lactation consult.  Doing well.  D/c with motrin/percocet/pnv.  F/U in 6 weeks   LOS: 3 days   Padilla,Regina Benish 10/27/2012, 8:22 AM

## 2012-10-27 NOTE — Anesthesia Postprocedure Evaluation (Signed)
Anesthesia Post Note  Patient: [name]  Procedure(s) Performed: * No procedures listed *  Anesthesia type: Epidural  Patient location: Mother/Baby  Post pain: Pain level controlled per nursing notes  Post assessment: Post-op Vital signs reviewed  Last Vitals: There were no vitals filed for this visit.  Post vital signs: Reviewed  Level of consciousness: awake per nursing notes  Complications: No apparent anesthesia complications

## 2012-11-09 ENCOUNTER — Encounter (HOSPITAL_COMMUNITY): Payer: Self-pay | Admitting: *Deleted

## 2013-04-12 ENCOUNTER — Telehealth: Payer: Self-pay | Admitting: Family Medicine

## 2013-04-12 NOTE — Telephone Encounter (Signed)
I reviewed LOV note, 11/23/12. I had that she had a cuff at home and was going to check routinely. Does she have cuff at home? Has she had other readings besides that one reading at the Depo shot?

## 2013-04-14 NOTE — Telephone Encounter (Signed)
Spoke to patient.  Had been taking random BP readings at home.  Has noticed upward trend.  Told to spot check over weekend and write down readings.  Made appt to see provider on Monday

## 2013-04-17 ENCOUNTER — Encounter: Payer: Self-pay | Admitting: Physician Assistant

## 2013-04-17 ENCOUNTER — Ambulatory Visit (INDEPENDENT_AMBULATORY_CARE_PROVIDER_SITE_OTHER): Payer: 59 | Admitting: Physician Assistant

## 2013-04-17 VITALS — BP 128/84 | HR 84 | Temp 98.4°F | Resp 20 | Wt 185.0 lb

## 2013-04-17 DIAGNOSIS — I10 Essential (primary) hypertension: Secondary | ICD-10-CM

## 2013-04-17 MED ORDER — HYDROCHLOROTHIAZIDE 12.5 MG PO CAPS: 12.5000 mg | ORAL_CAPSULE | Freq: Every day | ORAL | Status: DC

## 2013-04-17 NOTE — Progress Notes (Signed)
   Patient ID: [name] MRN: [id number], DOB: 1986/08/02, 27 y.o. Date of Encounter: 04/17/2013, 3:23 PM    Chief Complaint:  Chief Complaint  Patient presents with  . re check BP    was off meds but feels like back up again  was up at GYN     HPI: 27 y.o. year old AA female here for f/u of HTN. I last saw her for CPE 11/23/12. At that time, she had had a baby 5 weeks prior to that visit. She reprted that during that pregnancy Gyn had Rxed another BP med. Says that while she was in hospitla for delivery, her BP was lower so Gyn felt she could stay off BP med and f/u here. At that OV 11/23/12 BP was 136/86. Decided to stay off BP med and that she would monitor BP off med.  Last week she went to Gyn to get Depo Provera inj. Nurse told her tha tBP was 180/104. She had also gone to Gyn for potpartum visit in 4/14-BP was 160 systolic at that ov per pt.  She had not been checking BP at home until this past week-but she has Wrist cuff. Got 160/104, 130/86.   Home Meds: See attached medication section for any medications that were entered at today's visit. The computer does not put those onto this list.The following list is a list of meds entered prior to today's visit.   Current Outpatient Prescriptions on File Prior to Visit  Medication Sig Dispense Refill  . Prenatal Vit-Fe Fumarate-FA (PRENATAL MULTIVITAMIN) TABS Take 1 tablet by mouth daily.  30 tablet  12  . hydrochlorothiazide (MICROZIDE) 12.5 MG capsule Take 12.5 mg by mouth daily.      Marland Kitchen ibuprofen (ADVIL,MOTRIN) 800 MG tablet Take 1 tablet (800 mg total) by mouth every 8 (eight) hours as needed for pain.  45 tablet  1  . methyldopa (ALDOMET) 500 MG tablet Take 500 mg by mouth 2 (two) times daily.      Marland Kitchen oxyCODONE-acetaminophen (PERCOCET/ROXICET) 5-325 MG per tablet Take 1-2 tablets by mouth every 6 (six) hours as needed (moderate - severe pain).  15 tablet  0   No current facility-administered medications on file prior to visit.     Allergies: No Known Allergies    Review of Systems: See HPI for pertinent ROS. All other ROS negative.    Physical Exam: Blood pressure 128/84, pulse 84, temperature 98.4 F (36.9 C), temperature source Oral, resp. rate 20, weight 185 lb (83.915 kg), not currently breastfeeding., Body mass index is 36.13 kg/(m^2). BP by me: 134/94 x 2 General: Short, overweight AAF.  Appears in no acute distress.  Neck: Supple. No thyromegaly. No lymphadenopathy. Lungs: Clear bilaterally to auscultation without wheezes, rales, or rhonchi. Breathing is unlabored. Heart: Regular rhythm. No murmurs, rubs, or gallops. Msk:  Strength and tone normal for age. Extremities/Skin: Warm and dry.  No edema. No rashes or suspicious lesions. Neuro: Alert and oriented X 3. Moves all extremities spontaneously. Gait is normal. CNII-XII grossly in tact. Psych:  Responds to questions appropriately with a normal affect.     ASSESSMENT AND PLAN:  27 y.o. year old female with  1. Hypertension Restart prior BP med. F/u OV 2 weeks to recheck.  - hydrochlorothiazide (MICROZIDE) 12.5 MG capsule; Take 1 capsule (12.5 mg total) by mouth daily.  Dispense: 30 capsule; Refill: 1   Signed, [address] [place], [place], [facility] 04/17/2013 3:23 PM

## 2013-05-17 ENCOUNTER — Ambulatory Visit: Payer: 59 | Admitting: Physician Assistant

## 2013-07-27 ENCOUNTER — Other Ambulatory Visit: Payer: Self-pay

## 2014-03-08 ENCOUNTER — Other Ambulatory Visit: Payer: 59

## 2014-03-08 DIAGNOSIS — I10 Essential (primary) hypertension: Secondary | ICD-10-CM

## 2014-03-08 DIAGNOSIS — Z0000 Encounter for general adult medical examination without abnormal findings: Secondary | ICD-10-CM

## 2014-03-08 LAB — COMPLETE METABOLIC PANEL WITH GFR
ALK PHOS: 53 U/L (ref 39–117)
ALT: 14 U/L (ref 0–35)
AST: 12 U/L (ref 0–37)
Albumin: 4.3 g/dL (ref 3.5–5.2)
BILIRUBIN TOTAL: 0.3 mg/dL (ref 0.2–1.2)
BUN: 9 mg/dL (ref 6–23)
CO2: 25 meq/L (ref 19–32)
Calcium: 9.2 mg/dL (ref 8.4–10.5)
Chloride: 105 mEq/L (ref 96–112)
Creat: 0.68 mg/dL (ref 0.50–1.10)
GLUCOSE: 82 mg/dL (ref 70–99)
Potassium: 3.9 mEq/L (ref 3.5–5.3)
Sodium: 140 mEq/L (ref 135–145)
Total Protein: 7 g/dL (ref 6.0–8.3)

## 2014-03-08 LAB — CBC WITH DIFFERENTIAL/PLATELET
BASOS ABS: 0 10*3/uL (ref 0.0–0.1)
Basophils Relative: 0 % (ref 0–1)
EOS ABS: 0.1 10*3/uL (ref 0.0–0.7)
EOS PCT: 2 % (ref 0–5)
HEMATOCRIT: 35.1 % — AB (ref 36.0–46.0)
Hemoglobin: 11.6 g/dL — ABNORMAL LOW (ref 12.0–15.0)
LYMPHS PCT: 46 % (ref 12–46)
Lymphs Abs: 2.9 10*3/uL (ref 0.7–4.0)
MCH: 25.8 pg — AB (ref 26.0–34.0)
MCHC: 33 g/dL (ref 30.0–36.0)
MCV: 78 fL (ref 78.0–100.0)
MONO ABS: 0.4 10*3/uL (ref 0.1–1.0)
Monocytes Relative: 7 % (ref 3–12)
Neutro Abs: 2.9 10*3/uL (ref 1.7–7.7)
Neutrophils Relative %: 45 % (ref 43–77)
PLATELETS: 296 10*3/uL (ref 150–400)
RBC: 4.5 MIL/uL (ref 3.87–5.11)
RDW: 13.7 % (ref 11.5–15.5)
WBC: 6.4 10*3/uL (ref 4.0–10.5)

## 2014-03-08 LAB — LIPID PANEL
CHOL/HDL RATIO: 3.4 ratio
Cholesterol: 132 mg/dL (ref 0–200)
HDL: 39 mg/dL — AB (ref >39)
LDL CALC: 75 mg/dL (ref 0–99)
Triglycerides: 91 mg/dL (ref <150)
VLDL: 18 mg/dL (ref 0–40)

## 2014-03-08 LAB — TSH: TSH: 0.831 u[IU]/mL (ref 0.350–4.500)

## 2014-03-09 LAB — VITAMIN D 25 HYDROXY (VIT D DEFICIENCY, FRACTURES): Vit D, 25-Hydroxy: 15 ng/mL — ABNORMAL LOW (ref 30–89)

## 2014-03-12 ENCOUNTER — Encounter: Payer: Self-pay | Admitting: Physician Assistant

## 2014-03-12 ENCOUNTER — Ambulatory Visit (INDEPENDENT_AMBULATORY_CARE_PROVIDER_SITE_OTHER): Payer: 59 | Admitting: Physician Assistant

## 2014-03-12 VITALS — BP 126/80 | HR 84 | Temp 98.1°F | Resp 18 | Ht 59.75 in | Wt 188.0 lb

## 2014-03-12 DIAGNOSIS — Z0000 Encounter for general adult medical examination without abnormal findings: Secondary | ICD-10-CM

## 2014-03-12 DIAGNOSIS — E669 Obesity, unspecified: Secondary | ICD-10-CM

## 2014-03-12 DIAGNOSIS — I10 Essential (primary) hypertension: Secondary | ICD-10-CM

## 2014-03-12 DIAGNOSIS — E559 Vitamin D deficiency, unspecified: Secondary | ICD-10-CM

## 2014-03-12 MED ORDER — HYDROCHLOROTHIAZIDE 12.5 MG PO CAPS: 12.5000 mg | ORAL_CAPSULE | Freq: Every day | ORAL | Status: DC

## 2014-03-12 NOTE — Progress Notes (Signed)
Patient ID: [name] MRN: [id number], DOB: 1986-04-10, 28 y.o. Date of Encounter: 03/12/2014,   Chief Complaint: Physical (CPE)  HPI: 28 y.o. y/o AA female  here for CPE.   She sees a gynecologist for GYN exam.  She is on HCTZ for hypertension. She states that she does not check her blood pressure on a routine basis. However sometimes if she has a headache-- she feels that the headache is because her blood pressure is high--then she checks BP at those times. She has sometimes gotten readings as high as 147/90 but usually is in the 120s. Again, this is at the time that she has headache and thinks BP might be elevated. She does not check blood pressure at other times when she is feeling good and feeling like BP is good.  She has no complaints. States that she has no new medical updates / information from the past year.   Review of Systems: Consitutional: No fever, chills, fatigue, night sweats, lymphadenopathy. No significant/unexplained weight changes. Eyes: No visual changes, eye redness, or discharge. ENT/Mouth: No ear pain, sore throat, nasal drainage, or sinus pain. Cardiovascular: No chest pressure,heaviness, tightness or squeezing, even with exertion. No increased shortness of breath or dyspnea on exertion.No palpitations, edema, orthopnea, PND. Respiratory: No cough, hemoptysis, SOB, or wheezing. Gastrointestinal: No anorexia, dysphagia, reflux, pain, nausea, vomiting, hematemesis, diarrhea, constipation, BRBPR, or melena. Breast: No mass, nodules, bulging, or retraction. No skin changes or inflammation. No nipple discharge. No lymphadenopathy. Genitourinary: No dysuria, hematuria, incontinence, vaginal discharge, pruritis, burning, abnormal bleeding, or pain. Musculoskeletal: No decreased ROM, No joint pain or swelling. No significant pain in neck, back, or extremities. Skin: No rash, pruritis, or concerning lesions. Neurological: No headache, dizziness, syncope,  seizures, tremors, memory loss, coordination problems, or paresthesias. Psychological: No anxiety, depression, hallucinations, SI/HI. Endocrine: No polydipsia, polyphagia, polyuria, or known diabetes.No increased fatigue. No palpitations/rapid heart rate. No significant/unexplained weight change. All other systems were reviewed and are otherwise negative.  Past Medical History  Diagnosis Date  . VBAC, delivered, current hospitalization 10/25/2012  . Hypertension      Past Surgical History  Procedure Laterality Date  . Cesarean section      Home Meds:  Outpatient Prescriptions Prior to Visit  Medication Sig Dispense Refill  . ibuprofen (ADVIL,MOTRIN) 800 MG tablet Take 1 tablet (800 mg total) by mouth every 8 (eight) hours as needed for pain.  45 tablet  1  . hydrochlorothiazide (MICROZIDE) 12.5 MG capsule Take 12.5 mg by mouth daily.      . methyldopa (ALDOMET) 500 MG tablet Take 500 mg by mouth 2 (two) times daily.      Marland Kitchen. oxyCODONE-acetaminophen (PERCOCET/ROXICET) 5-325 MG per tablet Take 1-2 tablets by mouth every 6 (six) hours as needed (moderate - severe pain).  15 tablet  0  . Prenatal Vit-Fe Fumarate-FA (PRENATAL MULTIVITAMIN) TABS Take 1 tablet by mouth daily.  30 tablet  12  . hydrochlorothiazide (MICROZIDE) 12.5 MG capsule Take 1 capsule (12.5 mg total) by mouth daily.  30 capsule  1  . medroxyPROGESTERone (DEPO-PROVERA) 150 MG/ML injection Inject 150 mg into the muscle every 3 (three) months.       No facility-administered medications prior to visit.    Allergies: No Known Allergies  History   Social History  . Marital Status: Single    Spouse Name: N/A    Number of Children: N/A  . Years of Education: N/A   Occupational History  . Not on file.  Social History Main Topics  . Smoking status: Never Smoker   . Smokeless tobacco: Never Used  . Alcohol Use: No  . Drug Use: No  . Sexual Activity: Yes   Other Topics Concern  . Not on file   Social History  Narrative   Entered 2015:   Works at [employer]--in [place]   2 children-- 1 Boy--Age 58 y/o                  --- 1 Girl--Age 47 y/o    Family History  Problem Relation Age of Onset  . Hypertension Other   . Diabetes Other   . Hyperlipidemia Mother   . Hypertension Mother     Physical Exam: Blood pressure 126/80, pulse 84, temperature 98.1 F (36.7 C), temperature source Oral, resp. rate 18, height 4' 11.75" (1.518 m), weight 188 lb (85.276 kg), last menstrual period 02/19/2014, not currently breastfeeding., Body mass index is 37.01 kg/(m^2). General: Obese AAF. Appears in no acute distress. HEENT: Normocephalic, atraumatic. Conjunctiva pink, sclera non-icteric. Pupils 2 mm constricting to 1 mm, round, regular, and equally reactive to light and accomodation. EOMI. Internal auditory canal clear. TMs with good cone of light and without pathology. Nasal mucosa pink. Nares are without discharge. No sinus tenderness. Oral mucosa pink.  Pharynx without exudate.   Neck: Supple. Trachea midline. No thyromegaly. Full ROM. No lymphadenopathy.No Carotid Bruits. Lungs: Clear to auscultation bilaterally without wheezes, rales, or rhonchi. Breathing is of normal effort and unlabored. Cardiovascular: RRR with S1 S2. No murmurs, rubs, or gallops. Distal pulses 2+ symmetrically. No carotid or abdominal bruits. Breast: Deferred. This is performed by GYN. Abdomen: Soft, non-tender, non-distended with normoactive bowel sounds. No hepatosplenomegaly or masses. No rebound/guarding. No CVA tenderness. No hernias.  Genitourinary: Deferred. This is performed by GYN. Musculoskeletal: Full range of motion and 5/5 strength throughout. Without swelling, atrophy, tenderness, crepitus, or warmth. Extremities without clubbing, cyanosis, or edema. Calves supple. Skin: Warm and moist without erythema, ecchymosis, wounds, or rash. Neuro: A+Ox3. CN II-XII grossly intact. Moves all extremities spontaneously. Full sensation  throughout. Normal gait. DTR 2+ throughout upper and lower extremities. Finger to nose intact. Psych:  Responds to questions appropriately with a normal affect.   Assessment/Plan:  28 y.o. y/o female here for CPE 1. Visit for preventive health examination  A. Screening Labs: She recently came in screening labs performed. She had very minimal anemia which was much improved since her last prior CBC. I told her to take some over-the-counter iron for a while it appears this up. He does have significant vitamin D deficiency with a level being 15. Told her to start over-the-counter vitamin D at 4000 units daily. Remainder of labs including CMET, TSH, FLP were all normal.  B. Pap: --Per Gyn  C. Immunizations:  Influenza:  N/N ( it is July) Tetanus:   07/16/2010   2. Essential hypertension Blood Pressure is controlled. Lab normal.  Continue current medicine. - hydrochlorothiazide (MICROZIDE) 12.5 MG capsule; Take 1 capsule (12.5 mg total) by mouth daily.  Dispense: 30 capsule; Refill: 11  3. Unspecified vitamin D deficiency Start over-the-counter vitamin D 4000 units daily.  4. Obesity Reviewed the fact that her weight at last years physical was 169 and today is at 188. At the time of her physical last year her baby was 305 weeks old. Patient states that she thinks the main reason she has gained this weight over this year is that she was on Depo-Provera for about 6 or  7 months. Says GYN did stop that because she was having headaches and weight gain with DepoProvera.   Murray Hodgkins [place], [place], [facility] 03/12/2014 2:27 PM

## 2014-07-23 ENCOUNTER — Encounter: Payer: Self-pay | Admitting: Physician Assistant

## 2015-04-12 ENCOUNTER — Other Ambulatory Visit: Payer: Self-pay | Admitting: Physician Assistant

## 2015-04-15 NOTE — Telephone Encounter (Signed)
Medication refilled per protocol. 

## 2016-05-22 ENCOUNTER — Other Ambulatory Visit: Payer: Self-pay | Admitting: Physician Assistant

## 2019-09-22 NOTE — L&D Delivery Note (Signed)
Operative Delivery Note At 2:52 PM a viable female was delivered via Vaginal, Vacuum Investment banker, operational).  Presentation: vertex; Position: Right,, Occiput,, Anterior; Station: +3.  Verbal consent: obtained from patient.  Risks and benefits discussed in detail.  Risks include, but are not limited to the risks of anesthesia, bleeding, infection, damage to maternal tissues, fetal cephalhematoma.  There is also the risk of inability to effect vaginal delivery of the head, or shoulder dystocia that cannot be resolved by established maneuvers, leading to the need for emergency cesarean section.  Vacuum assistance recommended for FHR in 80s, Kiwi cup used and pulled with 2 ctx, vtx spontaneously rotated from ROP to ROA  APGAR: 9, 9; weight pending.   Placenta status: spontaneous, intact.   Cord:  with the following complications: none.   Anesthesia:  Epidural Instruments: Kiwi Episiotomy: None Lacerations:  Hemostatic abrasions Suture Repair: none Est. Blood Loss (mL): 400  Mom to postpartum.  Baby to Couplet care / Skin to Skin.  They desire circumcision  Zenaida Niece 05/12/2020, 3:06 PM

## 2019-10-19 LAB — OB RESULTS CONSOLE GC/CHLAMYDIA
Chlamydia: NEGATIVE
Gonorrhea: NEGATIVE

## 2019-10-19 LAB — OB RESULTS CONSOLE ABO/RH: RH Type: POSITIVE

## 2019-10-19 LAB — OB RESULTS CONSOLE RUBELLA ANTIBODY, IGM: Rubella: NON-IMMUNE/NOT IMMUNE

## 2019-10-19 LAB — OB RESULTS CONSOLE HEPATITIS B SURFACE ANTIGEN: Hepatitis B Surface Ag: NEGATIVE

## 2019-10-19 LAB — OB RESULTS CONSOLE ANTIBODY SCREEN: Antibody Screen: NEGATIVE

## 2019-10-19 LAB — OB RESULTS CONSOLE RPR: RPR: NONREACTIVE

## 2019-10-19 LAB — OB RESULTS CONSOLE HIV ANTIBODY (ROUTINE TESTING): HIV: NONREACTIVE

## 2019-10-24 ENCOUNTER — Other Ambulatory Visit: Payer: Self-pay

## 2019-10-24 ENCOUNTER — Inpatient Hospital Stay (HOSPITAL_COMMUNITY): Payer: 59

## 2019-10-24 ENCOUNTER — Inpatient Hospital Stay (HOSPITAL_COMMUNITY)
Admission: AD | Admit: 2019-10-24 | Discharge: 2019-10-24 | Disposition: A | Discharge status: Home / Self Care | Payer: 59 | Attending: Obstetrics and Gynecology | Admitting: Obstetrics and Gynecology

## 2019-10-24 ENCOUNTER — Encounter (HOSPITAL_COMMUNITY): Payer: Self-pay | Admitting: Obstetrics and Gynecology

## 2019-10-24 DIAGNOSIS — O208 Other hemorrhage in early pregnancy: Secondary | ICD-10-CM | POA: Diagnosis not present

## 2019-10-24 DIAGNOSIS — O418X10 Other specified disorders of amniotic fluid and membranes, first trimester, not applicable or unspecified: Secondary | ICD-10-CM

## 2019-10-24 DIAGNOSIS — Z3A09 9 weeks gestation of pregnancy: Secondary | ICD-10-CM | POA: Insufficient documentation

## 2019-10-24 DIAGNOSIS — Z3491 Encounter for supervision of normal pregnancy, unspecified, first trimester: Secondary | ICD-10-CM

## 2019-10-24 DIAGNOSIS — O26899 Other specified pregnancy related conditions, unspecified trimester: Secondary | ICD-10-CM

## 2019-10-24 DIAGNOSIS — O468X1 Other antepartum hemorrhage, first trimester: Secondary | ICD-10-CM

## 2019-10-24 DIAGNOSIS — Z791 Long term (current) use of non-steroidal anti-inflammatories (NSAID): Secondary | ICD-10-CM | POA: Diagnosis not present

## 2019-10-24 DIAGNOSIS — O209 Hemorrhage in early pregnancy, unspecified: Secondary | ICD-10-CM

## 2019-10-24 DIAGNOSIS — R109 Unspecified abdominal pain: Secondary | ICD-10-CM | POA: Diagnosis not present

## 2019-10-24 LAB — URINALYSIS, ROUTINE W REFLEX MICROSCOPIC
Bacteria, UA: NONE SEEN
Bilirubin Urine: NEGATIVE
Glucose, UA: NEGATIVE mg/dL
Ketones, ur: 5 mg/dL — AB
Nitrite: NEGATIVE
Protein, ur: 30 mg/dL — AB
Specific Gravity, Urine: 1.026 (ref 1.005–1.030)
pH: 7 (ref 5.0–8.0)

## 2019-10-24 LAB — POCT PREGNANCY, URINE: Preg Test, Ur: POSITIVE — AB

## 2019-10-24 LAB — WET PREP, GENITAL
Sperm: NONE SEEN
Trich, Wet Prep: NONE SEEN
Yeast Wet Prep HPF POC: NONE SEEN

## 2019-10-24 NOTE — MAU Note (Signed)
Presents with c/o abdominal cramping and pinkish vaginal discharge that began this morning.  Denies recent intercourse.  LMP 08/17/19.  +HPT.  States has established [facility] and has had first visit.

## 2019-10-24 NOTE — MAU Provider Note (Signed)
Chief Complaint: Abdominal Pain and Vaginal Bleeding   First Provider Initiated Contact with Patient 10/24/19 0825     SUBJECTIVE HPI: [name] is a 34 y.o. G3P2002 at [redacted]w[redacted]d who presents to Maternity Admissions reporting vaginal bleeding and abdominal cramping. When wiping this morning noticed pink tinged blood on toilet paper. Not bleeding into a pad or passing blood clots. States the cramping isn't really painful but was more concerned about the bleeding. Denies dysuria or vaginal discharge. No recent intercourse. Had a TVUS in the office last week that confirmed IUP. Goes to [facility] ob[facility]yn.   Location: abdomen Quality: cramping Severity: 3/10 on pain scale Duration: 1 day Timing: intermittent Modifying factors: none Associated signs and symptoms: vaginal bleeding  Past Medical History:  Diagnosis Date  . Hypertension   . VBAC, delivered, current hospitalization 10/25/2012   OB History  Gravida Para Term Preterm AB Living  3 2 2     2   SAB TAB Ectopic Multiple Live Births          2    # Outcome Date GA Lbr Len/2nd Weight Sex Delivery Anes PTL Lv  3 Current           2 Term 10/25/12 [redacted]w[redacted]d 14:47 / 02:08 2775 g F VBAC EPI  LIV     Birth Comments: CAH--Normal GAL--Normal Thyroid-Normal Biotinidase- Normal Hemoglobin--Normal, FA Amino Acid Profile-Normal Acylcarnitine Profile-Normal   1 Term 2009     CS-LTranv   LIV     Birth Comments: non reassuring heart rate.    Past Surgical History:  Procedure Laterality Date  . CESAREAN SECTION     Social History   Socioeconomic History  . Marital status: Single    Spouse name: Not on file  . Number of children: Not on file  . Years of education: Not on file  . Highest education level: Not on file  Occupational History  . Not on file  Tobacco Use  . Smoking status: Never Smoker  . Smokeless tobacco: Never Used  Substance and Sexual Activity  . Alcohol use: No  . Drug use: No  . Sexual activity: Yes  Other  Topics Concern  . Not on file  Social History Narrative   Entered 2015:   Works at 2016   2 children-- 1 Boy--Age 69 y/o                  --- 1 Girl--Age 70 y/o   Social Determinants of [employer] Strain:   . Difficulty of Paying Living Expenses: Not on file  Food Insecurity:   . Worried About Corporate investment banker in the Last Year: Not on file  . Ran Out of Food in the Last Year: Not on file  Transportation Needs:   . Lack of Transportation (Medical): Not on file  . Lack of Transportation (Non-Medical): Not on file  Physical Activity:   . Days of Exercise per Week: Not on file  . Minutes of Exercise per Session: Not on file  Stress:   . Feeling of Stress : Not on file  Social Connections:   . Frequency of Communication with Friends and Family: Not on file  . Frequency of Social Gatherings with Friends and Family: Not on file  . Attends Religious Services: Not on file  . Active Member of Clubs or Organizations: Not on file  . Attends Programme researcher, broadcasting/film/video Meetings: Not on file  . Marital Status: Not on file  Intimate Partner Violence:   . Fear of Current or Ex-Partner: Not on file  . Emotionally Abused: Not on file  . Physically Abused: Not on file  . Sexually Abused: Not on file   Family History  Problem Relation Age of Onset  . Hyperlipidemia Mother   . Hypertension Mother   . Hypertension Other   . Diabetes Other    No current facility-administered medications on file prior to encounter.   Current Outpatient Medications on File Prior to Encounter  Medication Sig Dispense Refill  . methyldopa (ALDOMET) 500 MG tablet Take 500 mg by mouth 2 (two) times daily.    . Prenatal Vit-Fe Fumarate-FA (PRENATAL MULTIVITAMIN) TABS Take 1 tablet by mouth daily. 30 tablet 12  . hydrochlorothiazide (MICROZIDE) 12.5 MG capsule TAKE ONE CAPSULE BY MOUTH ONCE DAILY 90 capsule 3  . ibuprofen (ADVIL,MOTRIN) 800 MG tablet Take 1 tablet (800 mg total) by mouth  every 8 (eight) hours as needed for pain. 45 tablet 1  . oxyCODONE-acetaminophen (PERCOCET/ROXICET) 5-325 MG per tablet Take 1-2 tablets by mouth every 6 (six) hours as needed (moderate - severe pain). 15 tablet 0   No Known Allergies  I have reviewed patient's Past Medical Hx, Surgical Hx, Family Hx, Social Hx, medications and allergies.   Review of Systems  Gastrointestinal: Positive for abdominal pain. Negative for constipation, diarrhea, nausea and vomiting.  Genitourinary: Positive for vaginal bleeding. Negative for dysuria and vaginal discharge.    OBJECTIVE Patient Vitals for the past 24 hrs:  BP Temp Temp src Pulse Resp SpO2 Height Weight  10/24/19 0818 134/76 -- -- 98 -- -- -- --  10/24/19 0807 (!) 146/97 98.4 F (36.9 C) Oral (!) 105 20 100 % 5' (1.524 m) 83.7 kg   Constitutional: Well-developed, well-nourished female in no acute distress.  Cardiovascular: normal rate & rhythm, no murmur Respiratory: normal rate and effort. Lung sounds clear throughout GI: Abd soft, non-tender, Pos BS x 4. No guarding or rebound tenderness MS: Extremities nontender, no edema, normal ROM Neurologic: Alert and oriented x 4.  GU:     SPECULUM EXAM: NEFG, physiologic discharge, no blood noted, cervix clean. Cervix somewhat friable.  BIMANUAL: No CMT. cervix closed; uterus normal size, no adnexal tenderness or masses.    LAB RESULTS Results for orders placed or performed during the hospital encounter of 10/24/19 (from the past 24 hour(s))  Urinalysis, Routine w reflex microscopic     Status: Abnormal   Collection Time: 10/24/19  8:03 AM  Result Value Ref Range   Color, Urine YELLOW YELLOW   APPearance CLEAR CLEAR   Specific Gravity, Urine 1.026 1.005 - 1.030   pH 7.0 5.0 - 8.0   Glucose, UA NEGATIVE NEGATIVE mg/dL   Hgb urine dipstick MODERATE (A) NEGATIVE   Bilirubin Urine NEGATIVE NEGATIVE   Ketones, ur 5 (A) NEGATIVE mg/dL   Protein, ur 30 (A) NEGATIVE mg/dL   Nitrite NEGATIVE  NEGATIVE   Leukocytes,Ua TRACE (A) NEGATIVE   RBC / HPF 0-5 0 - 5 RBC/hpf   WBC, UA 6-10 0 - 5 WBC/hpf   Bacteria, UA NONE SEEN NONE SEEN   Squamous Epithelial / LPF 0-5 0 - 5   Mucus PRESENT   Pregnancy, urine POC     Status: Abnormal   Collection Time: 10/24/19  8:06 AM  Result Value Ref Range   Preg Test, Ur POSITIVE (A) NEGATIVE  Wet prep, genital     Status: Abnormal   Collection Time: 10/24/19  8:39 AM  Specimen: Cervix  Result Value Ref Range   Yeast Wet Prep HPF POC NONE SEEN NONE SEEN   Trich, Wet Prep NONE SEEN NONE SEEN   Clue Cells Wet Prep HPF POC PRESENT (A) NONE SEEN   WBC, Wet Prep HPF POC MANY (A) NONE SEEN   Sperm NONE SEEN     IMAGING US OB Comp Less 14 Wks  Result Date: 10/24/2019 CLINICAL DATA:  Cramping and vaginal discharge. Positive pregnancy test. EXAM: OBSTETRIC <14 WK [place] AND TRANSVAGINAL OB US TECHNIQUE: Both transabdominal and transvaginal ultrasound examinations were performed for complete evaluation of the gestation as well as the maternal uterus, adnexal regions, and pelvic cul-de-sac. Transvaginal technique was performed to assess early pregnancy. COMPARISON:  None. FINDINGS: Intrauterine gestational sac: Present Yolk sac:  Present Embryo:  Present Cardiac Activity: Present Heart Rate: 173 bpm MSD:   mm    w     d CRL:  32.06 mm   10 w   1 d                  [place] EDC: 05/20/2020 Subchorionic hemorrhage:  Small Maternal uterus/adnexae: Normal ovaries. Corpus luteum cyst noted on the left. Small calcified fibroid noted in the posterior myometrium measuring 1.7 x 1.1 x 1.0 cm. IMPRESSION: 1. Single living intrauterine fetus estimated at 10 weeks 1 days gestation. 2. Small subchorionic hemorrhage. 3. Normal ovaries. Electronically Signed   By: Rudie Meyer M.D.   On: 10/24/2019 09:13    MAU COURSE Orders Placed This Encounter  Procedures  . Wet prep, genital  . Culture, OB Urine  . US OB Comp Less 14 Wks  . Urinalysis, Routine w reflex microscopic  .  Pregnancy, urine POC  . Discharge patient   No orders of the defined types were placed in this encounter.   MDM RH positive  Wet prep & GC/CT pending. Wet prep positive for clue cells but discharge no consistent with bacterial vaginosis, will defer treatment.   U/a with hemoglobin & leukocytes. No urinary complaints. Will send for culture.   Pt had IUP per ultrasound in the office but I don't have access to it & she is too early to doppler. Ultrasound ordered and shows live IUP with small subchorionic hemorrhage.   ASSESSMENT 1. Subchorionic hematoma in first trimester, single or unspecified fetus   2. Vaginal bleeding in pregnancy, first trimester   3. Abdominal cramping affecting pregnancy   4. Normal IUP (intrauterine pregnancy) on prenatal ultrasound, first trimester   5. [redacted] weeks gestation of pregnancy     PLAN Discharge home in stable condition. Bleeding precautions GC/CT & urine culture pending F/u with OB  Follow-up Information    [facility] Follow up.   Why: keep scheduled appointment or call office as needed Contact information: [address]  [address] [place] [address] [phone]        [facility] Follow up.   Specialty: Obstetrics and Gynecology Why: return for worsening symptoms Contact information: [address] [id number] Wilhemina Bonito [place] [address] [phone]         Allergies as of 10/24/2019   No Known Allergies     Medication List    STOP taking these medications   hydrochlorothiazide 12.5 MG capsule Commonly known as: MICROZIDE   ibuprofen 800 MG tablet Commonly known as: ADVIL   oxyCODONE-acetaminophen 5-325 MG tablet Commonly known as: PERCOCET/ROXICET     TAKE these medications   methyldopa 500  MG tablet Commonly known as: ALDOMET Take 500 mg by mouth 2 (two) times daily.   prenatal multivitamin Tabs tablet Take 1 tablet by mouth daily.         Judeth Horn, NP 10/24/2019  9:39 AM

## 2019-10-24 NOTE — Discharge Instructions (Signed)

## 2019-10-25 LAB — CULTURE, OB URINE: Culture: NO GROWTH

## 2019-10-25 LAB — GC/CHLAMYDIA PROBE AMP (~~LOC~~) NOT AT ARMC
Chlamydia: NEGATIVE
Comment: NEGATIVE
Comment: NORMAL
Neisseria Gonorrhea: NEGATIVE

## 2020-05-01 ENCOUNTER — Telehealth (HOSPITAL_COMMUNITY): Payer: Self-pay | Admitting: *Deleted

## 2020-05-01 NOTE — Telephone Encounter (Signed)
Preadmission screen  

## 2020-05-03 ENCOUNTER — Other Ambulatory Visit: Payer: Self-pay

## 2020-05-03 ENCOUNTER — Encounter (HOSPITAL_COMMUNITY): Payer: Self-pay | Admitting: Obstetrics and Gynecology

## 2020-05-03 ENCOUNTER — Inpatient Hospital Stay (HOSPITAL_COMMUNITY)
Admission: AD | Admit: 2020-05-03 | Discharge: 2020-05-03 | Disposition: A | Discharge status: Home / Self Care | Payer: 59 | Attending: Obstetrics and Gynecology | Admitting: Obstetrics and Gynecology

## 2020-05-03 DIAGNOSIS — O10913 Unspecified pre-existing hypertension complicating pregnancy, third trimester: Secondary | ICD-10-CM | POA: Insufficient documentation

## 2020-05-03 DIAGNOSIS — O34212 Maternal care for vertical scar from previous cesarean delivery: Secondary | ICD-10-CM | POA: Insufficient documentation

## 2020-05-03 DIAGNOSIS — Z3A37 37 weeks gestation of pregnancy: Secondary | ICD-10-CM | POA: Diagnosis not present

## 2020-05-03 DIAGNOSIS — O10919 Unspecified pre-existing hypertension complicating pregnancy, unspecified trimester: Secondary | ICD-10-CM

## 2020-05-03 DIAGNOSIS — Z8249 Family history of ischemic heart disease and other diseases of the circulatory system: Secondary | ICD-10-CM | POA: Diagnosis not present

## 2020-05-03 LAB — CBC
HCT: 33.3 % — ABNORMAL LOW (ref 36.0–46.0)
Hemoglobin: 10.7 g/dL — ABNORMAL LOW (ref 12.0–15.0)
MCH: 25.7 pg — ABNORMAL LOW (ref 26.0–34.0)
MCHC: 32.1 g/dL (ref 30.0–36.0)
MCV: 79.9 fL — ABNORMAL LOW (ref 80.0–100.0)
Platelets: 253 10*3/uL (ref 150–400)
RBC: 4.17 MIL/uL (ref 3.87–5.11)
RDW: 13.5 % (ref 11.5–15.5)
WBC: 8 10*3/uL (ref 4.0–10.5)
nRBC: 0 % (ref 0.0–0.2)

## 2020-05-03 LAB — COMPREHENSIVE METABOLIC PANEL
ALT: 12 U/L (ref 0–44)
AST: 15 U/L (ref 15–41)
Albumin: 3.4 g/dL — ABNORMAL LOW (ref 3.5–5.0)
Alkaline Phosphatase: 119 U/L (ref 38–126)
Anion gap: 11 (ref 5–15)
BUN: 6 mg/dL (ref 6–20)
CO2: 19 mmol/L — ABNORMAL LOW (ref 22–32)
Calcium: 8.8 mg/dL — ABNORMAL LOW (ref 8.9–10.3)
Chloride: 107 mmol/L (ref 98–111)
Creatinine, Ser: 0.66 mg/dL (ref 0.44–1.00)
GFR calc Af Amer: >60 mL/min (ref >60)
GFR calc non Af Amer: >60 mL/min (ref >60)
Glucose, Bld: 84 mg/dL (ref 70–99)
Potassium: 3.5 mmol/L (ref 3.5–5.1)
Sodium: 137 mmol/L (ref 135–145)
Total Bilirubin: 0.3 mg/dL (ref 0.3–1.2)
Total Protein: 7.2 g/dL (ref 6.5–8.1)

## 2020-05-03 LAB — PROTEIN / CREATININE RATIO, URINE
Creatinine, Urine: 35.23 mg/dL
Total Protein, Urine: <6 mg/dL

## 2020-05-03 LAB — URINALYSIS, ROUTINE W REFLEX MICROSCOPIC
Bacteria, UA: NONE SEEN
Bilirubin Urine: NEGATIVE
Glucose, UA: NEGATIVE mg/dL
Ketones, ur: NEGATIVE mg/dL
Nitrite: NEGATIVE
Protein, ur: NEGATIVE mg/dL
Specific Gravity, Urine: 1.005 (ref 1.005–1.030)
pH: 7 (ref 5.0–8.0)

## 2020-05-03 MED ORDER — ACETAMINOPHEN 325 MG PO TABS
650.0000 mg | ORAL_TABLET | Freq: Once | ORAL | Status: AC
  Administered 2020-05-03: 650 mg via ORAL
  Filled 2020-05-03: qty 2

## 2020-05-03 NOTE — MAU Note (Signed)
Just went in for regular visit.  BP for elevated, sent in for further eval. Denies headache, visual changes, epigastric pain or increase in swelling. No pain, bleeding or leaking. +FM.

## 2020-05-03 NOTE — MAU Provider Note (Signed)
Chief Complaint:  Hypertension  HPI: [name] is a 34 y.o. G3P2002 at [redacted]w[redacted]d by L/10 who presents to maternity admissions given elevated blood pressures in clinic earlier today.  Specifically, pt was seen at her prenatal clinic today at which time her blood pressure readings x3 were 160s/80s. She reported slight bilateral headache several days ago that resolved without medications per prenatal provider. Cervical check in clinic this morning reportedly 2-3/50/-3.  On arrival to MAU pt reports good adherence with her PNV, ASA and procardia XL 60mg  daily (last dose this morning at 0830). Pt reports intermittent checks of home blood pressures but no recent elevated measurements. Pt reports mild headache rated 3/10 without medication prior to arrival given "mild" in nature. Pt also reports intermittent contractions.  No concern of vision changes, cough, congestion, chest pain, SOB, epigastric pain, vaginal bleeding, LOF, vaginal itching/burning, urinary symptoms, dizziness, n/v, or fever/chills. Endorses good fetal movement.  HPI  Past Medical History: Past Medical History:  Diagnosis Date  . Hypertension   . VBAC, delivered, current hospitalization 10/25/2012    Past obstetric history: OB History  Gravida Para Term Preterm AB Living  3 2 2     2   SAB TAB Ectopic Multiple Live Births          2    # Outcome Date GA Lbr Len/2nd Weight Sex Delivery Anes PTL Lv  3 Current           2 Term 10/25/12 [redacted]w[redacted]d 14:47 / 02:08 2775 g F VBAC EPI  LIV     Birth Comments: CAH--Normal GAL--Normal Thyroid-Normal Biotinidase- Normal Hemoglobin--Normal, FA Amino Acid Profile-Normal Acylcarnitine Profile-Normal   1 Term 2009     CS-LTranv   LIV     Birth Comments: non reassuring heart rate.     Past Surgical History: Past Surgical History:  Procedure Laterality Date  . CESAREAN SECTION      Family History: Family History  Problem Relation Age of Onset  . Hyperlipidemia Mother   .  Hypertension Mother   . Hypertension Other   . Diabetes Other     Social History: Social History   Tobacco Use  . Smoking status: Never Smoker  . Smokeless tobacco: Never Used  Substance Use Topics  . Alcohol use: No  . Drug use: No    Allergies: No Known Allergies  Meds:  No medications prior to admission.    ROS:  Review of Systems  Constitutional: Negative for activity change, chills and fever.  HENT: Negative for congestion and sore throat.   Eyes: Negative for photophobia and visual disturbance.  Respiratory: Negative for cough, chest tightness and shortness of breath.   Cardiovascular: Negative for chest pain, palpitations and leg swelling.  Gastrointestinal: Negative for abdominal pain, constipation, diarrhea, nausea and vomiting.  Genitourinary: Negative for difficulty urinating, dysuria, vaginal bleeding, vaginal discharge and vaginal pain.  Musculoskeletal: Negative for back pain.  Neurological: Positive for headaches. Negative for seizures, weakness and light-headedness.    I have reviewed patient's Past Medical Hx, Surgical Hx, Family Hx, Social Hx, medications and allergies.   Physical Exam   Patient Vitals for the past 24 hrs:  BP Temp Temp src Pulse Resp SpO2 Height Weight  05/03/20 1652 -- -- -- -- 16 -- -- --  05/03/20 1630 124/76 -- -- (!) 102 -- 100 % -- --  05/03/20 1615 124/78 -- -- (!) 106 -- -- -- --  05/03/20 1600 127/81 -- -- 05/05/20 115 -- -- -- --  05/03/20 1545 122/82 -- -- (!) 107 -- -- -- --  05/03/20 1535 -- -- -- -- -- 100 % -- --  05/03/20 1530 134/79 -- -- 97 -- 100 % -- --  05/03/20 1520 -- -- -- -- -- 100 % -- --  05/03/20 1515 128/77 -- -- 100 -- -- -- --  05/03/20 1505 -- -- -- -- -- 100 % -- --  05/03/20 1500 132/75 -- -- (!) 111 -- 99 % -- --  05/03/20 1455 -- -- -- -- -- 100 % -- --  05/03/20 1440 -- -- -- -- -- 100 % -- --  05/03/20 1435 -- -- -- -- -- 99 % -- --  05/03/20 1430 124/74 -- -- (!) 109 -- -- -- --  05/03/20  1425 -- -- -- -- -- 99 % -- --  05/03/20 1420 -- -- -- -- -- 100 % -- --  05/03/20 1415 124/78 -- -- (!) 107 -- 100 % -- --  05/03/20 1410 -- -- -- -- -- 100 % -- --  05/03/20 1400 123/77 -- -- (!) 113 -- 100 % -- --  05/03/20 1355 -- -- -- -- -- 100 % -- --  05/03/20 1351 133/77 -- -- (!) 113 -- -- -- --  05/03/20 1321 (!) 146/87 98.7 F (37.1 C) Oral (!) 119 20 100 % 5' (1.524 m) 83.7 kg   Constitutional: Well-developed, well-nourished female in no acute distress. Sitting up comfortably on stretcher. Cardiovascular: normal rate Respiratory: normal effort GI: Abd soft, non-tender, gravid appropriate for gestational age.  MS: Extremities nontender, no edema, normal ROM Neurologic: Alert and oriented x 4.  Psych: appropriate mood and affect  PELVIC EXAM: 3/50/-3 (consistent with prior exam in clinic)   FHT:  Baseline 135, moderate variability, accelerations present, no decelerations, Reactive Contractions: intermittent   Labs: Results for orders placed or performed during the hospital encounter of 05/03/20 (from the past 24 hour(s))  Urinalysis, Routine w reflex microscopic Urine, Clean Catch     Status: Abnormal   Collection Time: 05/03/20  1:29 PM  Result Value Ref Range   Color, Urine STRAW (A) YELLOW   APPearance CLEAR CLEAR   Specific Gravity, Urine 1.005 1.005 - 1.030   pH 7.0 5.0 - 8.0   Glucose, UA NEGATIVE NEGATIVE mg/dL   Hgb urine dipstick SMALL (A) NEGATIVE   Bilirubin Urine NEGATIVE NEGATIVE   Ketones, ur NEGATIVE NEGATIVE mg/dL   Protein, ur NEGATIVE NEGATIVE mg/dL   Nitrite NEGATIVE NEGATIVE   Leukocytes,Ua SMALL (A) NEGATIVE   RBC / HPF 0-5 0 - 5 RBC/hpf   WBC, UA 0-5 0 - 5 WBC/hpf   Bacteria, UA NONE SEEN NONE SEEN   Squamous Epithelial / LPF 0-5 0 - 5  Protein / creatinine ratio, urine     Status: None   Collection Time: 05/03/20  1:37 PM  Result Value Ref Range   Creatinine, Urine 35.23 mg/dL   Total Protein, Urine <6.0 mg/dL   Protein Creatinine  Ratio        0.00 - 0.15 mg/mg[Cre]  Comprehensive metabolic panel     Status: Abnormal   Collection Time: 05/03/20  1:48 PM  Result Value Ref Range   Sodium 137 135 - 145 mmol/L   Potassium 3.5 3.5 - 5.1 mmol/L   Chloride 107 98 - 111 mmol/L   CO2 19 (L) 22 - 32 mmol/L   Glucose, Bld 84 70 - 99 mg/dL   BUN 6 6 - 20 mg/dL  Creatinine, Ser 0.66 0.44 - 1.00 mg/dL   Calcium 8.8 (L) 8.9 - 10.3 mg/dL   Total Protein 7.2 6.5 - 8.1 g/dL   Albumin 3.4 (L) 3.5 - 5.0 g/dL   AST 15 15 - 41 U/L   ALT 12 0 - 44 U/L   Alkaline Phosphatase 119 38 - 126 U/L   Total Bilirubin 0.3 0.3 - 1.2 mg/dL   GFR calc non Af Amer >60 >60 mL/min   GFR calc Af Amer >60 >60 mL/min   Anion gap 11 5 - 15  CBC     Status: Abnormal   Collection Time: 05/03/20  1:48 PM  Result Value Ref Range   WBC 8.0 4.0 - 10.5 K/uL   RBC 4.17 3.87 - 5.11 MIL/uL   Hemoglobin 10.7 (L) 12.0 - 15.0 g/dL   HCT 35.5 (L) 36 - 46 %   MCV 79.9 (L) 80.0 - 100.0 fL   MCH 25.7 (L) 26.0 - 34.0 pg   MCHC 32.1 30.0 - 36.0 g/dL   RDW 97.4 16.3 - 84.5 %   Platelets 253 150 - 400 K/uL   nRBC 0.0 0.0 - 0.2 %   O/Positive/-- (01/28 0000)  Imaging:  No results found.  MAU Course/MDM: Orders Placed This Encounter  Procedures  . Urinalysis, Routine w reflex microscopic Urine, Clean Catch  . Protein / creatinine ratio, urine  . Comprehensive metabolic panel  . CBC  . Discharge patient    Meds ordered this encounter  Medications  . acetaminophen (TYLENOL) tablet 650 mg     NST reviewed and Reactive. Treatments in MAU included tylenol for mild headache. Pt discharge with strict return precautions for preeclampsia symptoms, labor symptoms and elevated blood pressures.  1348: HELLP labs wnl except for H&H 10.7 & 33.3 respectively 1522: tylenol administered given headache rated 5/10 1557: pt reports slight improvement in HA; provided snack given no po intake since this AM 1630: pt reports near resolution of HA s/p administration of  tylenol and po intake  Assessment & Plan: 1. Chronic hypertension in pregnancy: Pt is a 34 year old G3P2002 at [redacted]w[redacted]d who presents to MAU given elevated blood pressures to 160s/80s today in clinic in the setting of cHTN previously well-controlled on procardia XL 60mg  daily. On arrival to MAU pt well-appearing with a slight headache that nearly resolved s/p tylenol and po intake. Initial BP 146/87 with all subsequent blood pressures wnl for >3 hours. HELLP labs notable for H&H 10.7 & 33.3 but otherwise wnl. NST reactive and cervical exam unchanged from prior exam in clinic prior to arrival. Given no evidence of preeclampsia or worsening cHTN will discharge home with close f/u and strict return precautions. -instructed continued procardia XL 60mg  daily as prescribed -emphasized importance of regular home BP checks daily and as needed for symptoms -f/u with prenatal provider on 8/16; instructed pt to call sooner for preeclampsia symptoms, labor symptoms and elevated blood pressures -plan for IOL on 8/25 as previously scheduled given desire for TOLAC     Allergies as of 05/03/2020   No Known Allergies     Medication List    STOP taking these medications   methyldopa 500 MG tablet Commonly known as: ALDOMET     TAKE these medications   NIFEdipine 60 MG 24 hr tablet Commonly known as: ADALAT CC Take 60 mg by mouth daily.   prenatal multivitamin Tabs tablet Take 1 tablet by mouth daily.      9/25, MD OB Fellow, [facility] 05/03/2020  5:19 PM

## 2020-05-03 NOTE — Discharge Instructions (Signed)
You were seen in the Maternity Assessment Unit today given concern of elevated blood pressures in clinic. Reassuringly, your blood pressures were improved on arrival. Your initial blood pressure was slightly elevated and then all additional blood pressures were in normal range. No sign of preeclampsia based on your blood and urine tests. Your headache improved with tylenol. Your cervical exam was consistent with the exam in your clinic prior to arrival.  Please keep you prenatal appointment on 8/16 or call sooner if concern of severe headache not improved with tylenol, vision changes, chest pain, difficulty breathing, elevated blood pressures, worsening contractions, loss of fluid, vaginal bleeding, or decreased fetal movement.

## 2020-05-12 ENCOUNTER — Encounter (HOSPITAL_COMMUNITY): Payer: Self-pay | Admitting: Obstetrics and Gynecology

## 2020-05-12 ENCOUNTER — Inpatient Hospital Stay (HOSPITAL_COMMUNITY): Payer: 59 | Admitting: Anesthesiology

## 2020-05-12 ENCOUNTER — Other Ambulatory Visit: Payer: Self-pay

## 2020-05-12 ENCOUNTER — Inpatient Hospital Stay (HOSPITAL_COMMUNITY)
Admission: AD | Admit: 2020-05-12 | Discharge: 2020-05-13 | DRG: 807 | Disposition: A | Discharge status: Home / Self Care | Payer: 59 | Attending: Obstetrics and Gynecology | Admitting: Obstetrics and Gynecology

## 2020-05-12 DIAGNOSIS — O99214 Obesity complicating childbirth: Secondary | ICD-10-CM | POA: Diagnosis present

## 2020-05-12 DIAGNOSIS — O1002 Pre-existing essential hypertension complicating childbirth: Secondary | ICD-10-CM | POA: Diagnosis present

## 2020-05-12 DIAGNOSIS — O34211 Maternal care for low transverse scar from previous cesarean delivery: Secondary | ICD-10-CM | POA: Diagnosis present

## 2020-05-12 DIAGNOSIS — Z20822 Contact with and (suspected) exposure to covid-19: Secondary | ICD-10-CM | POA: Diagnosis present

## 2020-05-12 DIAGNOSIS — O99824 Streptococcus B carrier state complicating childbirth: Secondary | ICD-10-CM | POA: Diagnosis present

## 2020-05-12 DIAGNOSIS — Z3A38 38 weeks gestation of pregnancy: Secondary | ICD-10-CM | POA: Diagnosis not present

## 2020-05-12 DIAGNOSIS — E669 Obesity, unspecified: Secondary | ICD-10-CM | POA: Diagnosis present

## 2020-05-12 DIAGNOSIS — O26893 Other specified pregnancy related conditions, third trimester: Secondary | ICD-10-CM | POA: Diagnosis present

## 2020-05-12 LAB — TYPE AND SCREEN
ABO/RH(D): O POS
Antibody Screen: NEGATIVE

## 2020-05-12 LAB — COMPREHENSIVE METABOLIC PANEL
ALT: 12 U/L (ref 0–44)
AST: 14 U/L — ABNORMAL LOW (ref 15–41)
Albumin: 3 g/dL — ABNORMAL LOW (ref 3.5–5.0)
Alkaline Phosphatase: 117 U/L (ref 38–126)
Anion gap: 11 (ref 5–15)
BUN: 8 mg/dL (ref 6–20)
CO2: 18 mmol/L — ABNORMAL LOW (ref 22–32)
Calcium: 9.1 mg/dL (ref 8.9–10.3)
Chloride: 107 mmol/L (ref 98–111)
Creatinine, Ser: 0.69 mg/dL (ref 0.44–1.00)
GFR calc Af Amer: >60 mL/min (ref >60)
GFR calc non Af Amer: >60 mL/min (ref >60)
Glucose, Bld: 75 mg/dL (ref 70–99)
Potassium: 3.8 mmol/L (ref 3.5–5.1)
Sodium: 136 mmol/L (ref 135–145)
Total Bilirubin: 0.3 mg/dL (ref 0.3–1.2)
Total Protein: 6.5 g/dL (ref 6.5–8.1)

## 2020-05-12 LAB — PROTEIN / CREATININE RATIO, URINE
Creatinine, Urine: 103.82 mg/dL
Protein Creatinine Ratio: 0.16 mg/mg{Cre} — ABNORMAL HIGH (ref 0.00–0.15)
Total Protein, Urine: 17 mg/dL

## 2020-05-12 LAB — CBC
HCT: 31.1 % — ABNORMAL LOW (ref 36.0–46.0)
Hemoglobin: 9.6 g/dL — ABNORMAL LOW (ref 12.0–15.0)
MCH: 24.7 pg — ABNORMAL LOW (ref 26.0–34.0)
MCHC: 30.9 g/dL (ref 30.0–36.0)
MCV: 79.9 fL — ABNORMAL LOW (ref 80.0–100.0)
Platelets: 234 10*3/uL (ref 150–400)
RBC: 3.89 MIL/uL (ref 3.87–5.11)
RDW: 13.6 % (ref 11.5–15.5)
WBC: 7.9 10*3/uL (ref 4.0–10.5)
nRBC: 0 % (ref 0.0–0.2)

## 2020-05-12 LAB — SARS CORONAVIRUS 2 BY RT PCR (HOSPITAL ORDER, PERFORMED IN ~~LOC~~ HOSPITAL LAB): SARS Coronavirus 2: NEGATIVE

## 2020-05-12 LAB — RPR: RPR Ser Ql: NONREACTIVE

## 2020-05-12 MED ORDER — EPHEDRINE 5 MG/ML INJ: 10.0000 mg | INTRAVENOUS | Status: DC | PRN

## 2020-05-12 MED ORDER — WITCH HAZEL-GLYCERIN EX PADS: 1.0000 "application " | MEDICATED_PAD | CUTANEOUS | Status: DC | PRN

## 2020-05-12 MED ORDER — ONDANSETRON HCL 4 MG/2ML IJ SOLN: 4.0000 mg | INTRAMUSCULAR | Status: DC | PRN

## 2020-05-12 MED ORDER — ONDANSETRON HCL 4 MG PO TABS: 4.0000 mg | ORAL_TABLET | ORAL | Status: DC | PRN

## 2020-05-12 MED ORDER — NIFEDIPINE ER OSMOTIC RELEASE 30 MG PO TB24
60.0000 mg | ORAL_TABLET | Freq: Once | ORAL | Status: AC
  Administered 2020-05-12: 60 mg via ORAL
  Filled 2020-05-12: qty 2

## 2020-05-12 MED ORDER — MAGNESIUM HYDROXIDE 400 MG/5ML PO SUSP: 30.0000 mL | ORAL | Status: DC | PRN

## 2020-05-12 MED ORDER — MEASLES, MUMPS & RUBELLA VAC IJ SOLR: 0.5000 mL | Freq: Once | INTRAMUSCULAR | Status: DC

## 2020-05-12 MED ORDER — METHYLERGONOVINE MALEATE 0.2 MG PO TABS: 0.2000 mg | ORAL_TABLET | ORAL | Status: DC | PRN

## 2020-05-12 MED ORDER — LACTATED RINGERS IV SOLN: 500.0000 mL | INTRAVENOUS | Status: DC | PRN

## 2020-05-12 MED ORDER — OXYCODONE-ACETAMINOPHEN 5-325 MG PO TABS: 1.0000 | ORAL_TABLET | ORAL | Status: DC | PRN

## 2020-05-12 MED ORDER — SODIUM CHLORIDE (PF) 0.9 % IJ SOLN
INTRAMUSCULAR | Status: DC | PRN
Start: 2020-05-12 — End: 2020-05-12
  Administered 2020-05-12: 12 mL/h via EPIDURAL

## 2020-05-12 MED ORDER — PHENYLEPHRINE 40 MCG/ML (10ML) SYRINGE FOR IV PUSH (FOR BLOOD PRESSURE SUPPORT)
80.0000 ug | PREFILLED_SYRINGE | INTRAVENOUS | Status: DC | PRN
  Filled 2020-05-12: qty 10

## 2020-05-12 MED ORDER — NIFEDIPINE ER OSMOTIC RELEASE 30 MG PO TB24
60.0000 mg | ORAL_TABLET | Freq: Every day | ORAL | Status: DC
  Administered 2020-05-13: 60 mg via ORAL
  Filled 2020-05-12: qty 2

## 2020-05-12 MED ORDER — SENNOSIDES-DOCUSATE SODIUM 8.6-50 MG PO TABS
2.0000 | ORAL_TABLET | ORAL | Status: DC
  Administered 2020-05-12: 2 via ORAL
  Filled 2020-05-12: qty 2

## 2020-05-12 MED ORDER — ZOLPIDEM TARTRATE 5 MG PO TABS: 5.0000 mg | ORAL_TABLET | Freq: Every evening | ORAL | Status: DC | PRN

## 2020-05-12 MED ORDER — DIPHENHYDRAMINE HCL 25 MG PO CAPS: 25.0000 mg | ORAL_CAPSULE | Freq: Four times a day (QID) | ORAL | Status: DC | PRN

## 2020-05-12 MED ORDER — PRENATAL MULTIVITAMIN CH
1.0000 | ORAL_TABLET | Freq: Every day | ORAL | Status: DC
  Administered 2020-05-13: 1 via ORAL
  Filled 2020-05-12: qty 1

## 2020-05-12 MED ORDER — OXYTOCIN-SODIUM CHLORIDE 30-0.9 UT/500ML-% IV SOLN
1.0000 m[IU]/min | INTRAVENOUS | Status: DC
  Administered 2020-05-12: 2 m[IU]/min via INTRAVENOUS

## 2020-05-12 MED ORDER — ONDANSETRON HCL 4 MG/2ML IJ SOLN: 4.0000 mg | Freq: Four times a day (QID) | INTRAMUSCULAR | Status: DC | PRN

## 2020-05-12 MED ORDER — IBUPROFEN 600 MG PO TABS
600.0000 mg | ORAL_TABLET | Freq: Four times a day (QID) | ORAL | Status: DC
  Administered 2020-05-12 – 2020-05-13 (×4): 600 mg via ORAL
  Filled 2020-05-12 (×3): qty 1

## 2020-05-12 MED ORDER — SOD CITRATE-CITRIC ACID 500-334 MG/5ML PO SOLN: 30.0000 mL | ORAL | Status: DC | PRN

## 2020-05-12 MED ORDER — LACTATED RINGERS IV SOLN: 500.0000 mL | Freq: Once | INTRAVENOUS | Status: DC

## 2020-05-12 MED ORDER — LIDOCAINE HCL (PF) 1 % IJ SOLN: 30.0000 mL | INTRAMUSCULAR | Status: DC | PRN

## 2020-05-12 MED ORDER — OXYTOCIN BOLUS FROM INFUSION
333.0000 mL | Freq: Once | INTRAVENOUS | Status: AC
  Administered 2020-05-12: 333 mL via INTRAVENOUS

## 2020-05-12 MED ORDER — SIMETHICONE 80 MG PO CHEW: 80.0000 mg | CHEWABLE_TABLET | ORAL | Status: DC | PRN

## 2020-05-12 MED ORDER — PHENYLEPHRINE 40 MCG/ML (10ML) SYRINGE FOR IV PUSH (FOR BLOOD PRESSURE SUPPORT): 80.0000 ug | PREFILLED_SYRINGE | INTRAVENOUS | Status: DC | PRN

## 2020-05-12 MED ORDER — BENZOCAINE-MENTHOL 20-0.5 % EX AERO: 1.0000 "application " | INHALATION_SPRAY | CUTANEOUS | Status: DC | PRN

## 2020-05-12 MED ORDER — FENTANYL-BUPIVACAINE-NACL 0.5-0.125-0.9 MG/250ML-% EP SOLN
12.0000 mL/h | EPIDURAL | Status: DC | PRN
  Filled 2020-05-12: qty 250

## 2020-05-12 MED ORDER — LACTATED RINGERS IV SOLN: INTRAVENOUS | Status: DC

## 2020-05-12 MED ORDER — COCONUT OIL OIL: 1.0000 "application " | TOPICAL_OIL | Status: DC | PRN

## 2020-05-12 MED ORDER — PENICILLIN G POT IN DEXTROSE 60000 UNIT/ML IV SOLN
3.0000 10*6.[IU] | INTRAVENOUS | Status: DC
  Administered 2020-05-12 (×2): 3 10*6.[IU] via INTRAVENOUS
  Filled 2020-05-12 (×2): qty 50

## 2020-05-12 MED ORDER — METHYLERGONOVINE MALEATE 0.2 MG/ML IJ SOLN: 0.2000 mg | INTRAMUSCULAR | Status: DC | PRN

## 2020-05-12 MED ORDER — DIPHENHYDRAMINE HCL 50 MG/ML IJ SOLN: 12.5000 mg | INTRAMUSCULAR | Status: DC | PRN

## 2020-05-12 MED ORDER — DIBUCAINE (PERIANAL) 1 % EX OINT: 1.0000 "application " | TOPICAL_OINTMENT | CUTANEOUS | Status: DC | PRN

## 2020-05-12 MED ORDER — SODIUM CHLORIDE 0.9 % IV SOLN
5.0000 10*6.[IU] | Freq: Once | INTRAVENOUS | Status: AC
  Administered 2020-05-12: 5 10*6.[IU] via INTRAVENOUS
  Filled 2020-05-12: qty 5

## 2020-05-12 MED ORDER — TERBUTALINE SULFATE 1 MG/ML IJ SOLN: 0.2500 mg | Freq: Once | INTRAMUSCULAR | Status: DC | PRN

## 2020-05-12 MED ORDER — OXYCODONE-ACETAMINOPHEN 5-325 MG PO TABS: 2.0000 | ORAL_TABLET | ORAL | Status: DC | PRN

## 2020-05-12 MED ORDER — ACETAMINOPHEN 325 MG PO TABS
650.0000 mg | ORAL_TABLET | ORAL | Status: DC | PRN
  Administered 2020-05-12: 650 mg via ORAL
  Filled 2020-05-12: qty 2

## 2020-05-12 MED ORDER — ACETAMINOPHEN 325 MG PO TABS: 650.0000 mg | ORAL_TABLET | ORAL | Status: DC | PRN

## 2020-05-12 MED ORDER — TETANUS-DIPHTH-ACELL PERTUSSIS 5-2.5-18.5 LF-MCG/0.5 IM SUSP: 0.5000 mL | Freq: Once | INTRAMUSCULAR | Status: DC

## 2020-05-12 MED ORDER — FLEET ENEMA 7-19 GM/118ML RE ENEM: 1.0000 | ENEMA | RECTAL | Status: DC | PRN

## 2020-05-12 MED ORDER — OXYCODONE HCL 5 MG PO TABS: 5.0000 mg | ORAL_TABLET | ORAL | Status: DC | PRN

## 2020-05-12 MED ORDER — OXYCODONE HCL 5 MG PO TABS: 10.0000 mg | ORAL_TABLET | ORAL | Status: DC | PRN

## 2020-05-12 MED ORDER — OXYTOCIN-SODIUM CHLORIDE 30-0.9 UT/500ML-% IV SOLN
2.5000 [IU]/h | INTRAVENOUS | Status: DC
  Filled 2020-05-12: qty 500

## 2020-05-12 MED ORDER — LIDOCAINE HCL (PF) 1 % IJ SOLN
INTRAMUSCULAR | Status: DC | PRN
  Administered 2020-05-12: 9 mL via EPIDURAL

## 2020-05-12 NOTE — Anesthesia Procedure Notes (Signed)
Epidural Patient location during procedure: OB Start time: 05/12/2020 7:20 AM End time: 05/12/2020 7:25 AM  Staffing Anesthesiologist: Leilani Able, MD Performed: anesthesiologist   Preanesthetic Checklist Completed: patient identified, IV checked, site marked, risks and benefits discussed, surgical consent, monitors and equipment checked, pre-op evaluation and timeout performed  Epidural Patient position: sitting Prep: DuraPrep and site prepped and draped Patient monitoring: continuous pulse ox and blood pressure Approach: midline Location: L3-L4 Injection technique: LOR air  Needle:  Needle type: Tuohy  Needle gauge: 17 G Needle length: 9 cm and 9 Needle insertion depth: 6 cm Catheter type: closed end flexible Catheter size: 19 Gauge Catheter at skin depth: 11 cm Test dose: negative  Assessment Events: blood not aspirated, injection not painful, no injection resistance, paresthesia and negative IV test  Additional Notes R legReason for block:procedure for pain

## 2020-05-12 NOTE — Anesthesia Preprocedure Evaluation (Signed)
Anesthesia Evaluation  Patient identified by MRN, date of birth, ID band Patient awake    Reviewed: Allergy & Precautions, H&P , NPO status , Patient's Chart, lab work & pertinent test results  Airway Mallampati: II  TM Distance: >3 FB Neck ROM: full    Dental no notable dental hx. (+) Teeth Intact   Pulmonary neg pulmonary ROS,    Pulmonary exam normal breath sounds clear to auscultation       Cardiovascular hypertension, negative cardio ROS Normal cardiovascular exam Rhythm:regular Rate:Normal     Neuro/Psych negative neurological ROS  negative psych ROS   GI/Hepatic negative GI ROS, Neg liver ROS,   Endo/Other  negative endocrine ROS  Renal/GU negative Renal ROS  negative genitourinary   Musculoskeletal negative musculoskeletal ROS (+)   Abdominal (+) + obese,   Peds  Hematology  (+) Blood dyscrasia, anemia ,   Anesthesia Other Findings   Reproductive/Obstetrics (+) Pregnancy                             Anesthesia Physical Anesthesia Plan  ASA: II  Anesthesia Plan: Epidural   Post-op Pain Management:    Induction:   PONV Risk Score and Plan:   Airway Management Planned:   Additional Equipment:   Intra-op Plan:   Post-operative Plan:   Informed Consent: I have reviewed the patients History and Physical, chart, labs and discussed the procedure including the risks, benefits and alternatives for the proposed anesthesia with the patient or authorized representative who has indicated his/her understanding and acceptance.       Plan Discussed with:   Anesthesia Plan Comments:         Anesthesia Quick Evaluation

## 2020-05-12 NOTE — MAU Note (Signed)
Pt here with reports of contractions that started around 2am, every 5-10 mins. She denies LOF or vaginal bleeding. Reports good fetal movement. Cervix was 3cm on last exam.

## 2020-05-12 NOTE — Progress Notes (Signed)
Comfortable with epidural Afeb, VSS FHT- 110-120, mod variability, early and variable decels, Cat II, ctx q 2-3 min VE-9/C/+1, vtx Continue pitocin and PCN, anticipate VBAC

## 2020-05-12 NOTE — Plan of Care (Signed)
  Problem: Education: Goal: Knowledge of Childbirth will improve Outcome: Progressing Goal: Ability to make informed decisions regarding treatment and plan of care will improve Outcome: Progressing Goal: Ability to state and carry out methods to decrease the pain will improve Outcome: Progressing Goal: Individualized Educational Video(s) Outcome: Progressing   Problem: Coping: Goal: Ability to verbalize concerns and feelings about labor and delivery will improve Outcome: Progressing   Problem: Life Cycle: Goal: Ability to make normal progression through stages of labor will improve Outcome: Progressing Goal: Ability to effectively push during vaginal delivery will improve Outcome: Progressing   Problem: Role Relationship: Goal: Will demonstrate positive interactions with the child Outcome: Progressing   Problem: Safety: Goal: Risk of complications during labor and delivery will decrease Outcome: Progressing   Problem: Pain Management: Goal: Relief or control of pain from uterine contractions will improve Outcome: Progressing   Problem: Health Behavior/Discharge Planning: Goal: Ability to manage health-related needs will improve Outcome: Progressing   Problem: Clinical Measurements: Goal: Ability to maintain clinical measurements within normal limits will improve Outcome: Progressing Goal: Will remain free from infection Outcome: Progressing Goal: Diagnostic test results will improve Outcome: Progressing Goal: Respiratory complications will improve Outcome: Progressing Goal: Cardiovascular complication will be avoided Outcome: Progressing   Problem: Activity: Goal: Risk for activity intolerance will decrease Outcome: Progressing   Problem: Nutrition: Goal: Adequate nutrition will be maintained Outcome: Progressing   Problem: Elimination: Goal: Will not experience complications related to bowel motility Outcome: Progressing Goal: Will not experience  complications related to urinary retention Outcome: Progressing   Problem: Safety: Goal: Ability to remain free from injury will improve Outcome: Progressing   Problem: Skin Integrity: Goal: Risk for impaired skin integrity will decrease Outcome: Progressing   

## 2020-05-12 NOTE — Progress Notes (Signed)
Informed MD that pt's fundal assessment was felt at U/E but also has a firm lump palpable to the right at 2 above U. MD informed that pt bleeding is stable and bladder emptied with 700 ml urine output.  No orders received at this time, ok to transfer pt to [facility].

## 2020-05-12 NOTE — H&P (Signed)
[name] is a 34 y.o. female G3P2002 at 38+ with contractions and cervical change.  Pt is TOLAC has had successful VBAC in past.  Pt has CHTN - controlled w Procardia XL 60.  PIH labs are normal.  Pregnancy also complicated by maternal obesity and rubella non immune - will vaccinate PP.  Pregnancy dated by LMP c/w early [place].  Received Tdap in [facility].  Had [facility] in early preg w VB.  Declined genetic screening.    OB History    Gravida  3   Para  2   Term  2   Preterm      AB      Living  2     SAB      TAB      Ectopic      Multiple      Live Births  2         h/o abn pp - last WNL H/o trich and Chl  G1 LTCS female 7#1 G2 VBAC female 6#2 G3 present  Past Medical History:  Diagnosis Date  . Hypertension   . Vaginal Pap smear, abnormal 2013  . VBAC, delivered, current hospitalization 10/25/2012   Past Surgical History:  Procedure Laterality Date  . CESAREAN SECTION     Family History: family history includes Asthma in her mother; Diabetes in her maternal grandfather and maternal grandmother; Hyperlipidemia in her mother; Hypertension in her maternal grandfather, maternal grandmother, and mother; Kidney disease in her maternal grandmother. Social History:  reports that she has never smoked. She has never used smokeless tobacco. 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   HENT: Negative.   Eyes: Negative for visual disturbance.  Respiratory: Negative.   Cardiovascular: Negative.   Gastrointestinal: Negative.    Genitourinary: Negative.   Musculoskeletal: Positive for back pain.  Skin: Negative.   Neurological: Negative.   Psychiatric/Behavioral: Negative.    Maternal Medical History:  Reason for admission: Contractions.   Contractions: Frequency: regular.   Perceived severity is moderate.    Fetal activity: Perceived fetal activity is normal.    Prenatal complications: PIH.   Prenatal Complications - Diabetes: none.    Dilation: 4.5 Effacement (%): 90 Station: -2 Exam by:: [employer], RN Blood pressure (!) 150/87, pulse 65, temperature 97.9 F (36.6 C), temperature source Oral, resp. rate 18, height 5' (1.524 m), weight 85.2 kg, last menstrual period 08/17/2019, SpO2 100 %. Maternal Exam:  Uterine Assessment: Contraction frequency is regular.   Abdomen: Patient reports no abdominal tenderness. Fundal height is appropriate for gesation.   Estimated fetal weight is 6-7#.   Fetal presentation: vertex  Introitus: Normal vulva. Normal vagina.  Cervix: Cervix evaluated by digital exam.     Physical Exam Constitutional:      Appearance: Normal appearance.  HENT:     Head: Normocephalic and atraumatic.  Cardiovascular:     Rate and Rhythm: Normal rate and regular rhythm.  Pulmonary:     Effort: Pulmonary effort is normal.     Breath sounds: Normal breath sounds.  Abdominal:     General: Bowel  sounds are normal.     Palpations: Abdomen is soft.     Comments: GRAVID  Genitourinary:    General: Normal vulva.  Musculoskeletal:        General: Normal range of motion.     Cervical back: Normal range of motion and neck supple.  Skin:    General: Skin is warm and dry.  Neurological:     General: No focal deficit present.     Mental Status: She is alert and oriented to person, place, and time.     Prenatal labs: ABO, Rh: --/--/O POS (08/22 4163) Antibody: NEG (08/22 0521) Rubella: Nonimmune (01/28 0000) RPR: Nonreactive (01/28 0000)  HBsAg: Negative (01/28 0000)  HIV:  Non-reactive (01/28 0000)  GBS:   positive  Hgb 11.0/Plt 331/Ur Cx neg/ GC neg/Chl neg/ Varicella immune/HCV neg/ glucola 143 - nl 3hr GTT/  Declined genetic screening US nl limited anat F/u completes nl anat, post plac Good growth  Tdap 6/10 Declined genetic screening  Assessment/Plan: 33yo G3P2002 at 38+ with ctx and cervical change - admitted in early labor TOLAC after successful VBAC RNI vaccination PP Augment prn GBBS + - PCN for prophylaxis Epidural prn Expect SVD   Dagny Fiorentino Bovard-Stuckert 05/12/2020, 7:09 AM

## 2020-05-12 NOTE — Progress Notes (Signed)
Comfortable with epidural Afeb, VSS, BP normal FHT-110-120, mod variability, some early decels, Cat I, ctx q 2-4 min VE-6/90/0, SROM clear  She is on low dose pitocin for augmentation, now with SROM, will monitor progress and BP, anticipate VBAC, continue PCN for +GBS

## 2020-05-13 ENCOUNTER — Other Ambulatory Visit (HOSPITAL_COMMUNITY): Payer: 59 | Attending: Obstetrics and Gynecology

## 2020-05-13 LAB — CBC
HCT: 26.1 % — ABNORMAL LOW (ref 36.0–46.0)
Hemoglobin: 8.2 g/dL — ABNORMAL LOW (ref 12.0–15.0)
MCH: 24.8 pg — ABNORMAL LOW (ref 26.0–34.0)
MCHC: 31.4 g/dL (ref 30.0–36.0)
MCV: 78.9 fL — ABNORMAL LOW (ref 80.0–100.0)
Platelets: 223 10*3/uL (ref 150–400)
RBC: 3.31 MIL/uL — ABNORMAL LOW (ref 3.87–5.11)
RDW: 13.5 % (ref 11.5–15.5)
WBC: 16 10*3/uL — ABNORMAL HIGH (ref 4.0–10.5)
nRBC: 0 % (ref 0.0–0.2)

## 2020-05-13 MED ORDER — IBUPROFEN 600 MG PO TABS: 600.0000 mg | ORAL_TABLET | Freq: Four times a day (QID) | ORAL | 1 refills | Status: AC | PRN

## 2020-05-13 NOTE — Discharge Summary (Signed)
Postpartum Discharge Summary  Date of Service updated      Patient Name: [name] DOB: 1986-02-04 MRN: [id number]  Date of admission: 05/12/2020 Delivery date:05/12/2020  Delivering provider: Willis Modena, TODD  Date of discharge: 05/13/2020  Admitting diagnosis: Indication for care in labor or delivery [O75.9] Intrauterine pregnancy: [redacted]w[redacted]d    Secondary diagnosis:  Active Problems:   Indication for care in labor or delivery  Additional problems: cHTN    Discharge diagnosis: Term Pregnancy Delivered and CHTN                                              Post partum procedures:n/a Augmentation: Pitocin Complications: None  Hospital course: Onset of Labor With Vaginal Delivery      34y.o. yo [id number] admitted in Latent Labor on 05/12/2020. Patient had an uncomplicated labor course as follows:  Membrane Rupture Time/Date: 9:48 AM ,05/12/2020   Delivery Method:Vaginal, Vacuum (Extractor)   VBAC Episiotomy: None  Lacerations:    Patient had an uncomplicated postpartum course.  She is ambulating, tolerating a regular diet, passing flatus, and urinating well. Patient is discharged home in stable condition on 05/13/20.  Newborn Data: Birth date:05/12/2020  Birth time:2:52 PM  Gender:Female  Living status:Living  Apgars:8 ,9  Weight:3100 g   Magnesium Sulfate received: No BMZ received: No Rhophylac:N/A MMR:N/A T-DaP:Given prenatally Flu: N/A Transfusion:No  Physical exam  Vitals:   05/12/20 1805 05/12/20 2116 05/12/20 2353 05/13/20 0540  BP: 126/81 130/78 124/70 129/78  Pulse: 93 98 83 75  Resp: 17 16 16 16   Temp:  98 F (36.7 C) 97.9 F (36.6 C) 98.2 F (36.8 C)  TempSrc:  Oral Oral Oral  SpO2:  100% 100% 100%  Weight:      Height:       General: alert, cooperative and no distress Lochia: appropriate Uterine Fundus: firm Incision: N/A DVT Evaluation: No evidence of DVT seen on physical exam. No significant calf/ankle edema. Labs: Lab  Results  Component Value Date   WBC 16.0 (H) 05/13/2020   HGB 8.2 (L) 05/13/2020   HCT 26.1 (L) 05/13/2020   MCV 78.9 (L) 05/13/2020   PLT 223 05/13/2020   CMP Latest Ref Rng & Units 05/12/2020  Glucose 70 - 99 mg/dL 75  BUN 6 - 20 mg/dL 8  Creatinine 0.44 - 1.00 mg/dL 0.69  Sodium 135 - 145 mmol/L 136  Potassium 3.5 - 5.1 mmol/L 3.8  Chloride 98 - 111 mmol/L 107  CO2 22 - 32 mmol/L 18(L)  Calcium 8.9 - 10.3 mg/dL 9.1  Total Protein 6.5 - 8.1 g/dL 6.5  Total Bilirubin 0.3 - 1.2 mg/dL 0.3  Alkaline Phos 38 - 126 U/L 117  AST 15 - 41 U/L 14(L)  ALT 0 - 44 U/L 12   Edinburgh Score: Edinburgh Postnatal Depression Scale Screening Tool 05/12/2020  I have been able to laugh and see the funny side of things. 0  I have looked forward with enjoyment to things. 0  I have blamed myself unnecessarily when things went wrong. 0  I have been anxious or worried for no good reason. 0  I have felt scared or panicky for no good reason. 0  Things have been getting on top of me. 0  I have been so unhappy that I have had difficulty sleeping. 0  I have  felt sad or miserable. 0  I have been so unhappy that I have been crying. 0  The thought of harming myself has occurred to me. 0  Edinburgh Postnatal Depression Scale Total 0      After visit meds:  Allergies as of 05/13/2020   No Known Allergies     Medication List    TAKE these medications   ibuprofen 600 MG tablet Commonly known as: ADVIL Take 1 tablet (600 mg total) by mouth every 6 (six) hours as needed for moderate pain or cramping.   NIFEdipine 60 MG 24 hr tablet Commonly known as: ADALAT CC Take 60 mg by mouth daily.   prenatal multivitamin Tabs tablet Take 1 tablet by mouth daily.        Discharge home in stable condition Infant Feeding: Bottle Infant Disposition:home with mother Discharge instruction: per After Visit Summary and Postpartum booklet. Activity: Advance as tolerated. Pelvic rest for 6 weeks.  Diet: low  salt diet Anticipated Birth Control: Unsure Postpartum Appointment:6 weeks Additional Postpartum F/U: BP check 1 week Future Appointments: Future Appointments  Date Time Provider Echo  05/13/2020  9:15 AM [facility] None   Follow up Visit:  Follow-up Information    Meisinger, Sherren Mocha, MD. Schedule an appointment as soon as possible for a visit.   Specialty: Obstetrics and Gynecology Why: 1 week BP check and 6 week postpartum visit Contact information: [address], [address] [place] [address] [phone]                   05/13/2020 Isaiah Serge, DO

## 2020-05-13 NOTE — Progress Notes (Signed)
Post Partum Day 1 Subjective: no complaints, up ad lib, voiding, tolerating PO, + flatus and lochia mild. States feels significantly better this am than did last night. She has had a BM. She denies HA, blurry vision, CP or SOB. She is bonding well with baby - bottlefeeding. She would like circumcision for baby today and aslo discharge home after 24hr testing on baby done ( if passes).   Objective: Blood pressure 129/78, pulse 75, temperature 98.2 F (36.8 C), temperature source Oral, resp. rate 16, height 5' (1.524 m), weight 85.2 kg, last menstrual period 08/17/2019, SpO2 100 %.  Physical Exam:  General: alert, cooperative and no distress Lochia: appropriate Uterine Fundus: firm Incision: n/a DVT Evaluation: No evidence of DVT seen on physical exam. No significant calf/ankle edema.  Recent Labs    05/12/20 0521 05/13/20 0506  HGB 9.6* 8.2*  HCT 31.1* 26.1*    Assessment/Plan: Discharge home and Circumcision prior to discharge  Instructions reviewed -  1 week BP check and 6 week postpartum visit   LOS: 1 day   [name] W [name] 05/13/2020, 8:37 AM

## 2020-05-13 NOTE — Anesthesia Postprocedure Evaluation (Signed)
Anesthesia Post Note  Patient: [name]  Procedure(s) Performed: AN AD HOC LABOR EPIDURAL     Patient location during evaluation: Mother Baby Anesthesia Type: Epidural Level of consciousness: awake Pain management: satisfactory to patient Vital Signs Assessment: post-procedure vital signs reviewed and stable Respiratory status: spontaneous breathing Cardiovascular status: stable Anesthetic complications: no   No complications documented.  Last Vitals:  Vitals:   05/12/20 2353 05/13/20 0540  BP: 124/70 129/78  Pulse: 83 75  Resp: 16 16  Temp: 36.6 C 36.8 C  SpO2: 100% 100%    Last Pain:  Vitals:   05/13/20 0540  TempSrc: Oral  PainSc: 0-No pain   Pain Goal:                   [employer]

## 2020-05-13 NOTE — Discharge Instructions (Signed)
Call office with any complaints [phone] 

## 2020-05-15 ENCOUNTER — Inpatient Hospital Stay (HOSPITAL_COMMUNITY): Admission: AD | Admit: 2020-05-15 | Payer: 59 | Source: Home / Self Care | Admitting: Obstetrics and Gynecology

## 2020-05-15 ENCOUNTER — Inpatient Hospital Stay (HOSPITAL_COMMUNITY): Payer: 59

## 2020-06-28 IMAGING — US US OB COMP LESS 14 WK
1 series · 15 of 28 positions shown · non-contrast
Comparison: None.

CLINICAL DATA: Cramping and vaginal discharge. Positive pregnancy
test.

EXAM:
OBSTETRIC <14 WK US AND TRANSVAGINAL OB US
TECHNIQUE: Both transabdominal and transvaginal ultrasound examinations were
performed for complete evaluation of the gestation as well as the
maternal uterus, adnexal regions, and pelvic cul-de-sac.
Transvaginal technique was performed to assess early pregnancy.

[Series 1: us ob comp less 14 wk · 15 of 32 slices shown]
[im 1/32]
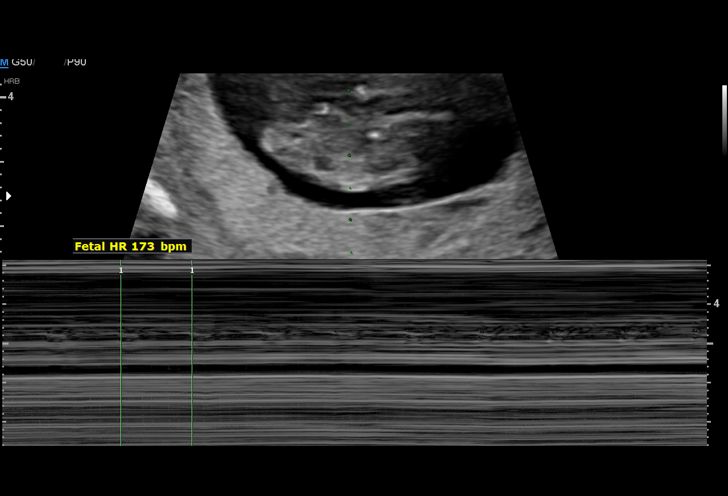
[im 3/32]
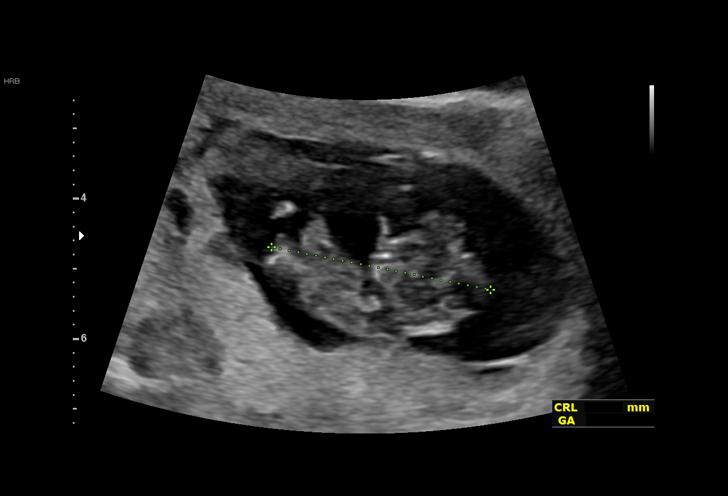
[im 5/32]
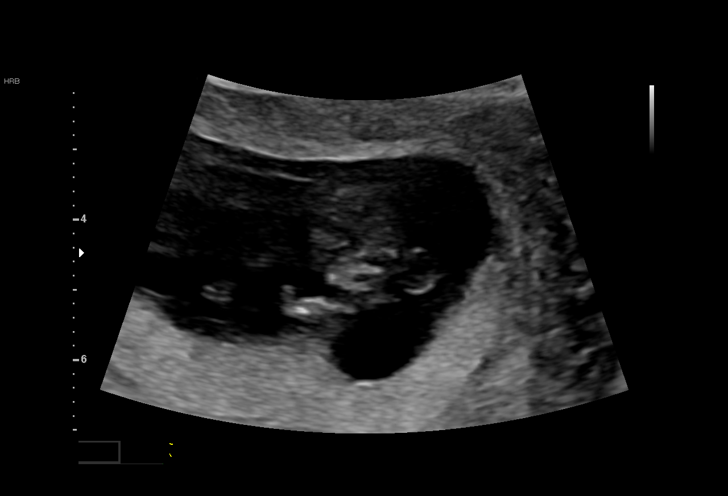
[im 7/32]
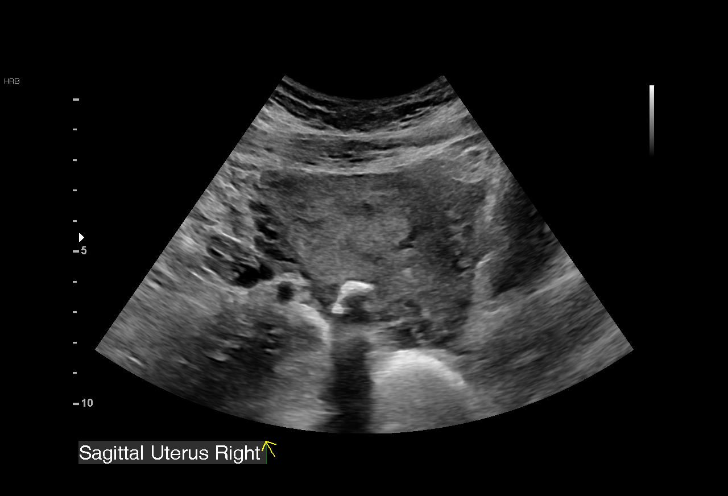
[im 10/32]
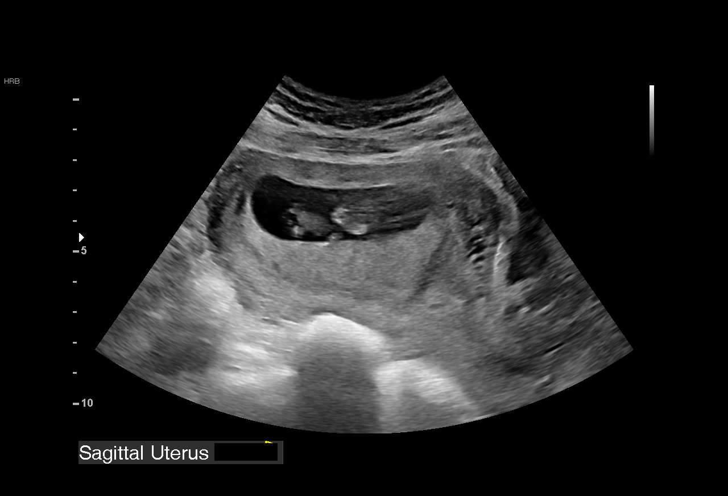
[im 12/32]
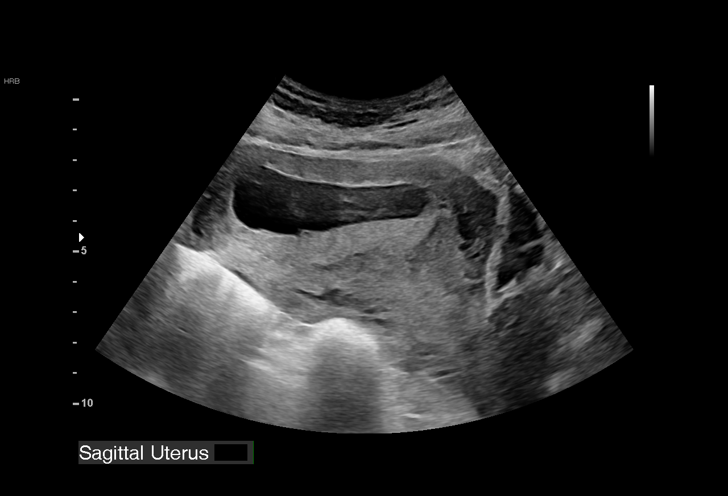
[im 14/32]
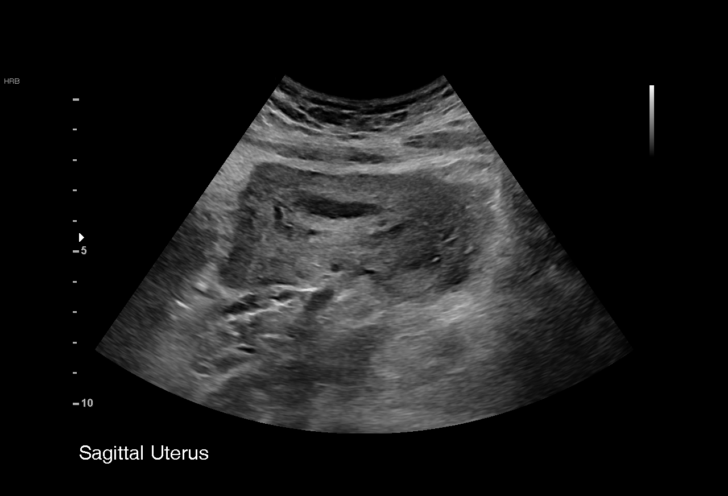
[im 17/32]
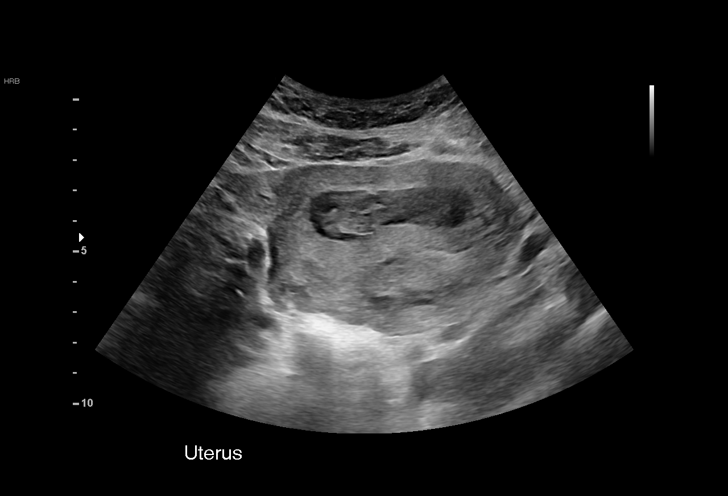
[im 18/32]
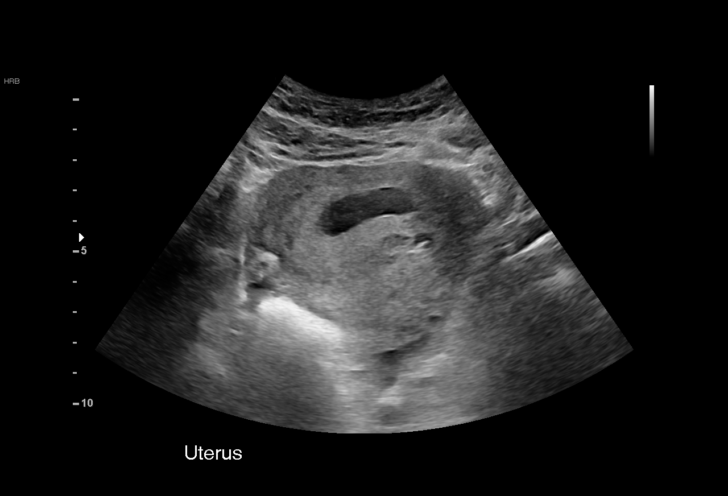
[im 20/32]
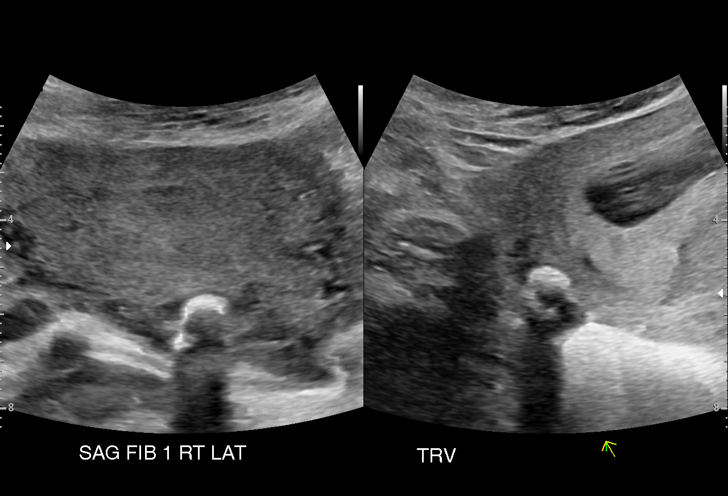
[im 22/32]
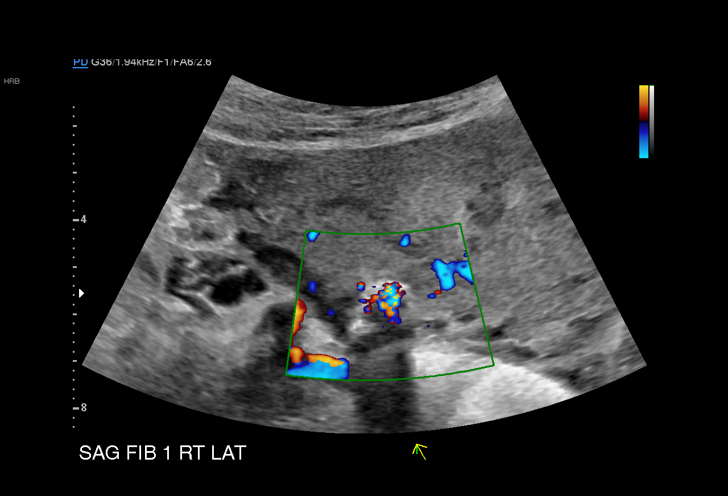
[im 25/32]
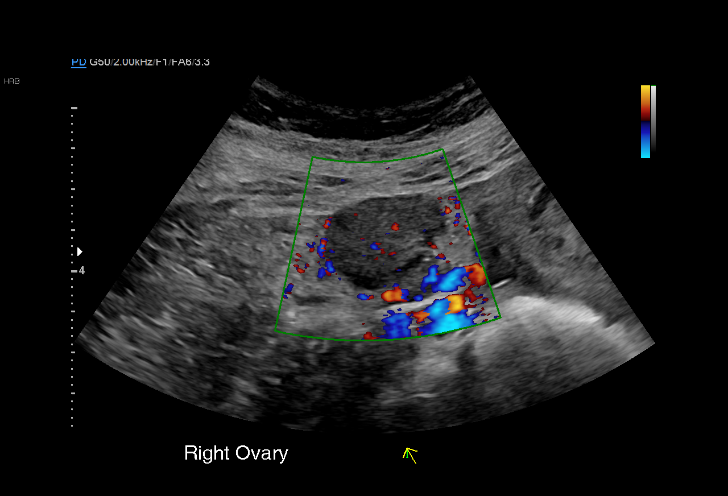
[im 27/32]
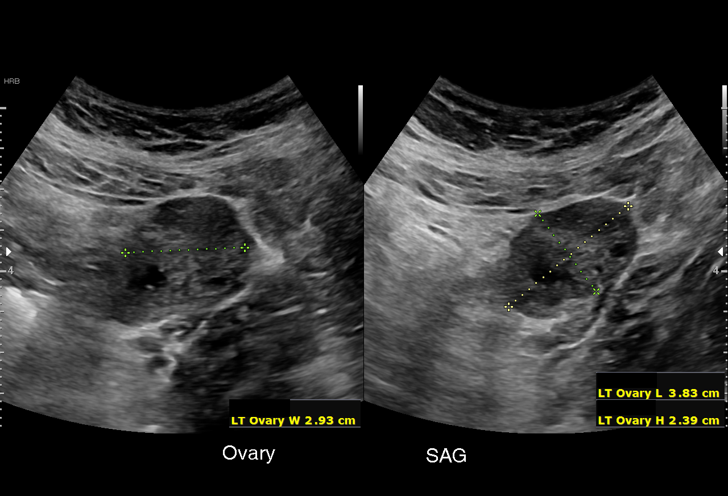
[im 29/32]
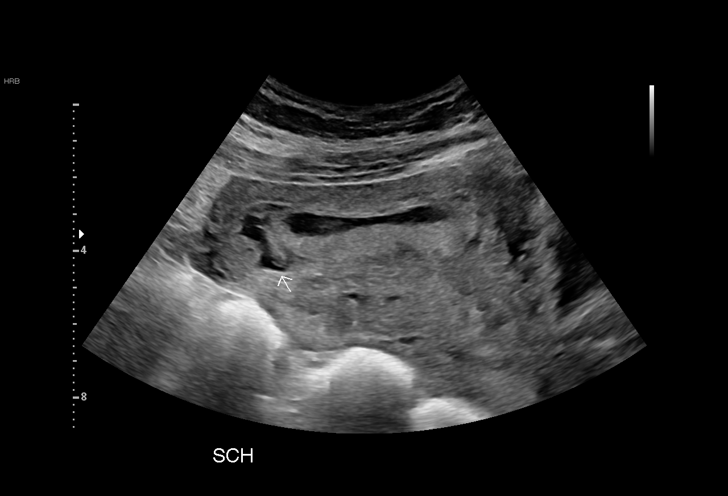
[im 32/32]
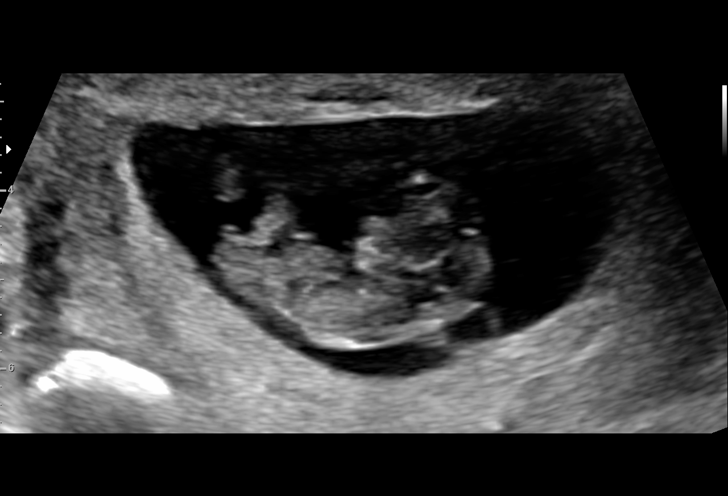

[15 of 28 positions shown; findings below may reference images not displayed]

FINDINGS: Intrauterine gestational sac: Present

Yolk sac:  Present

Embryo:  Present

Cardiac Activity: Present

Heart Rate: 173 bpm

MSD:   mm    w     d

CRL:  32.06 mm   10 w   1 d                  US EDC: 05/20/2020

Subchorionic hemorrhage:  Small

Maternal uterus/adnexae: Normal ovaries. Corpus luteum cyst noted on
the left.

Small calcified fibroid noted in the posterior myometrium measuring
1.7 x 1.1 x 1.0 cm.
IMPRESSION: 1. Single living intrauterine fetus estimated at 10 weeks 1 days
gestation.
2. Small subchorionic hemorrhage.
3. Normal ovaries.

## 2022-04-02 ENCOUNTER — Other Ambulatory Visit: Payer: Self-pay | Admitting: Obstetrics and Gynecology

## 2022-04-02 DIAGNOSIS — N6320 Unspecified lump in the left breast, unspecified quadrant: Secondary | ICD-10-CM

## 2022-04-22 ENCOUNTER — Ambulatory Visit
Admission: RE | Admit: 2022-04-22 | Discharge: 2022-04-22 | Disposition: A | Discharge status: Home / Self Care | Payer: 59 | Source: Ambulatory Visit | Attending: Obstetrics and Gynecology | Admitting: Obstetrics and Gynecology

## 2022-04-22 ENCOUNTER — Ambulatory Visit
Admission: RE | Admit: 2022-04-22 | Discharge: 2022-04-22 | Disposition: A | Discharge status: Home / Self Care | Payer: Medicaid Other | Source: Ambulatory Visit | Attending: Obstetrics and Gynecology | Admitting: Obstetrics and Gynecology

## 2022-04-22 DIAGNOSIS — N6320 Unspecified lump in the left breast, unspecified quadrant: Secondary | ICD-10-CM
